# Patient Record
Sex: Female | Born: 1955 | Race: White | Hispanic: No | Marital: Single | Smoking: Current every day smoker
Health system: Southern US, Community
[De-identification: ages and names within clinical notes are randomized; demographics above are authoritative.]

## PROBLEM LIST (undated history)

## (undated) DIAGNOSIS — F419 Anxiety disorder, unspecified: Secondary | ICD-10-CM

## (undated) DIAGNOSIS — C4492 Squamous cell carcinoma of skin, unspecified: Secondary | ICD-10-CM

## (undated) HISTORY — DX: Squamous cell carcinoma of skin, unspecified: C44.92

## (undated) HISTORY — PX: SKIN CANCER EXCISION: SHX779

## (undated) HISTORY — DX: Anxiety disorder, unspecified: F41.9

---

## 2001-10-13 ENCOUNTER — Other Ambulatory Visit: Admission: RE | Admit: 2001-10-13 | Discharge: 2001-10-13 | Payer: Self-pay | Admitting: Obstetrics & Gynecology

## 2002-12-06 ENCOUNTER — Other Ambulatory Visit: Admission: RE | Admit: 2002-12-06 | Discharge: 2002-12-06 | Payer: Self-pay | Admitting: Obstetrics & Gynecology

## 2003-12-11 ENCOUNTER — Other Ambulatory Visit: Admission: RE | Admit: 2003-12-11 | Discharge: 2003-12-11 | Payer: Self-pay | Admitting: Obstetrics & Gynecology

## 2004-12-10 ENCOUNTER — Other Ambulatory Visit: Admission: RE | Admit: 2004-12-10 | Discharge: 2004-12-10 | Payer: Self-pay | Admitting: Obstetrics & Gynecology

## 2005-11-16 ENCOUNTER — Ambulatory Visit: Payer: Self-pay | Admitting: Gastroenterology

## 2005-12-03 ENCOUNTER — Ambulatory Visit: Payer: Self-pay | Admitting: Gastroenterology

## 2006-01-25 ENCOUNTER — Other Ambulatory Visit: Admission: RE | Admit: 2006-01-25 | Discharge: 2006-01-25 | Payer: Self-pay | Admitting: Obstetrics & Gynecology

## 2013-07-18 ENCOUNTER — Other Ambulatory Visit: Payer: Self-pay | Admitting: Obstetrics & Gynecology

## 2013-07-18 DIAGNOSIS — R928 Other abnormal and inconclusive findings on diagnostic imaging of breast: Secondary | ICD-10-CM

## 2013-08-22 ENCOUNTER — Ambulatory Visit
Admission: RE | Admit: 2013-08-22 | Discharge: 2013-08-22 | Disposition: A | Discharge status: Home / Self Care | Payer: BC Managed Care – PPO | Source: Ambulatory Visit | Attending: Obstetrics & Gynecology | Admitting: Obstetrics & Gynecology

## 2013-08-22 DIAGNOSIS — R928 Other abnormal and inconclusive findings on diagnostic imaging of breast: Secondary | ICD-10-CM

## 2015-12-06 ENCOUNTER — Encounter: Payer: Self-pay | Admitting: Gastroenterology

## 2016-11-11 DIAGNOSIS — Z01419 Encounter for gynecological examination (general) (routine) without abnormal findings: Secondary | ICD-10-CM | POA: Diagnosis not present

## 2016-11-11 DIAGNOSIS — Z681 Body mass index (BMI) 19 or less, adult: Secondary | ICD-10-CM | POA: Diagnosis not present

## 2016-11-11 DIAGNOSIS — Z1329 Encounter for screening for other suspected endocrine disorder: Secondary | ICD-10-CM | POA: Diagnosis not present

## 2016-11-11 DIAGNOSIS — Z1231 Encounter for screening mammogram for malignant neoplasm of breast: Secondary | ICD-10-CM | POA: Diagnosis not present

## 2016-11-13 ENCOUNTER — Other Ambulatory Visit: Payer: Self-pay | Admitting: Obstetrics & Gynecology

## 2016-11-13 DIAGNOSIS — R928 Other abnormal and inconclusive findings on diagnostic imaging of breast: Secondary | ICD-10-CM

## 2016-11-24 ENCOUNTER — Ambulatory Visit
Admission: RE | Admit: 2016-11-24 | Discharge: 2016-11-24 | Disposition: A | Discharge status: Home / Self Care | Payer: BLUE CROSS/BLUE SHIELD | Source: Ambulatory Visit | Attending: Obstetrics & Gynecology | Admitting: Obstetrics & Gynecology

## 2016-11-24 DIAGNOSIS — N6323 Unspecified lump in the left breast, lower outer quadrant: Secondary | ICD-10-CM | POA: Diagnosis not present

## 2016-11-24 DIAGNOSIS — R928 Other abnormal and inconclusive findings on diagnostic imaging of breast: Secondary | ICD-10-CM

## 2016-11-24 DIAGNOSIS — N6321 Unspecified lump in the left breast, upper outer quadrant: Secondary | ICD-10-CM | POA: Diagnosis not present

## 2017-01-11 DIAGNOSIS — H2513 Age-related nuclear cataract, bilateral: Secondary | ICD-10-CM | POA: Diagnosis not present

## 2017-05-18 DIAGNOSIS — R634 Abnormal weight loss: Secondary | ICD-10-CM | POA: Diagnosis not present

## 2017-05-18 DIAGNOSIS — R630 Anorexia: Secondary | ICD-10-CM | POA: Diagnosis not present

## 2017-05-18 DIAGNOSIS — E559 Vitamin D deficiency, unspecified: Secondary | ICD-10-CM | POA: Diagnosis not present

## 2017-05-18 DIAGNOSIS — Z0000 Encounter for general adult medical examination without abnormal findings: Secondary | ICD-10-CM | POA: Diagnosis not present

## 2017-05-19 ENCOUNTER — Other Ambulatory Visit: Payer: Self-pay | Admitting: Internal Medicine

## 2017-05-19 DIAGNOSIS — R634 Abnormal weight loss: Secondary | ICD-10-CM

## 2017-05-20 DIAGNOSIS — E559 Vitamin D deficiency, unspecified: Secondary | ICD-10-CM | POA: Diagnosis not present

## 2017-05-20 DIAGNOSIS — R634 Abnormal weight loss: Secondary | ICD-10-CM | POA: Diagnosis not present

## 2017-05-20 DIAGNOSIS — Z0000 Encounter for general adult medical examination without abnormal findings: Secondary | ICD-10-CM | POA: Diagnosis not present

## 2017-05-24 ENCOUNTER — Ambulatory Visit
Admission: RE | Admit: 2017-05-24 | Discharge: 2017-05-24 | Disposition: A | Discharge status: Home / Self Care | Payer: BLUE CROSS/BLUE SHIELD | Source: Ambulatory Visit | Attending: Internal Medicine | Admitting: Internal Medicine

## 2017-05-24 DIAGNOSIS — R634 Abnormal weight loss: Secondary | ICD-10-CM

## 2017-05-24 DIAGNOSIS — K7689 Other specified diseases of liver: Secondary | ICD-10-CM | POA: Diagnosis not present

## 2017-05-24 MED ORDER — IOPAMIDOL (ISOVUE-300) INJECTION 61%
100.0000 mL | Freq: Once | INTRAVENOUS | Status: AC | PRN
  Administered 2017-05-24: 100 mL via INTRAVENOUS

## 2017-06-01 ENCOUNTER — Other Ambulatory Visit: Payer: Self-pay | Admitting: Internal Medicine

## 2017-06-01 DIAGNOSIS — F17210 Nicotine dependence, cigarettes, uncomplicated: Secondary | ICD-10-CM

## 2017-06-01 DIAGNOSIS — F419 Anxiety disorder, unspecified: Secondary | ICD-10-CM | POA: Diagnosis not present

## 2017-06-03 ENCOUNTER — Ambulatory Visit
Admission: RE | Admit: 2017-06-03 | Discharge: 2017-06-03 | Disposition: A | Discharge status: Home / Self Care | Payer: BLUE CROSS/BLUE SHIELD | Source: Ambulatory Visit | Attending: Internal Medicine | Admitting: Internal Medicine

## 2017-06-03 DIAGNOSIS — Z87891 Personal history of nicotine dependence: Secondary | ICD-10-CM | POA: Diagnosis not present

## 2017-06-03 DIAGNOSIS — F17210 Nicotine dependence, cigarettes, uncomplicated: Secondary | ICD-10-CM

## 2017-06-08 DIAGNOSIS — F17210 Nicotine dependence, cigarettes, uncomplicated: Secondary | ICD-10-CM | POA: Diagnosis not present

## 2017-06-08 DIAGNOSIS — F419 Anxiety disorder, unspecified: Secondary | ICD-10-CM | POA: Diagnosis not present

## 2017-06-08 DIAGNOSIS — J449 Chronic obstructive pulmonary disease, unspecified: Secondary | ICD-10-CM | POA: Diagnosis not present

## 2017-06-08 DIAGNOSIS — R918 Other nonspecific abnormal finding of lung field: Secondary | ICD-10-CM | POA: Diagnosis not present

## 2017-07-12 DIAGNOSIS — F419 Anxiety disorder, unspecified: Secondary | ICD-10-CM | POA: Diagnosis not present

## 2017-07-12 DIAGNOSIS — R251 Tremor, unspecified: Secondary | ICD-10-CM | POA: Diagnosis not present

## 2017-08-12 DIAGNOSIS — F419 Anxiety disorder, unspecified: Secondary | ICD-10-CM | POA: Diagnosis not present

## 2017-08-12 DIAGNOSIS — Z0000 Encounter for general adult medical examination without abnormal findings: Secondary | ICD-10-CM | POA: Diagnosis not present

## 2017-09-07 ENCOUNTER — Ambulatory Visit: Payer: BLUE CROSS/BLUE SHIELD | Admitting: Neurology

## 2017-12-09 ENCOUNTER — Ambulatory Visit: Payer: BLUE CROSS/BLUE SHIELD | Admitting: Neurology

## 2017-12-09 ENCOUNTER — Encounter: Payer: Self-pay | Admitting: Neurology

## 2017-12-09 DIAGNOSIS — F40298 Other specified phobia: Secondary | ICD-10-CM | POA: Diagnosis not present

## 2017-12-09 DIAGNOSIS — F419 Anxiety disorder, unspecified: Secondary | ICD-10-CM

## 2017-12-09 HISTORY — DX: Anxiety disorder, unspecified: F41.9

## 2017-12-09 MED ORDER — FLUOXETINE HCL 10 MG PO CAPS: ORAL_CAPSULE | ORAL | 3 refills | Status: DC

## 2017-12-09 NOTE — Progress Notes (Signed)
Reason for visit: Nervousness, jitters  Referring physician: Dr. Kyla Balzarine Prevette is a 62 y.o. female  History of present illness:  Ms. Delisi is a 62 year old right-handed white female with a history of a problem with feeling nervous and anxious over the last several months, she believes the problem began around August 2018.  The patient had been on antidepressant medications in the distant past for menopausal symptoms, but she has been off of these medications.  The patient underwent blood work, she claims that she did have a thyroid study done that was normal.  The patient was placed on clonazepam but she did not feel good on this medication was switched to mirtazapine or taking 15 mg at night.  The patient has had troubles with her appetite, she has been losing weight.  She has lost about 14 pounds over the last 3 years or so.  The patient reports no true tremors.  She has a problem with feeling as if her mind is racing.  She has unusual phobias, she has a phobia of germs, she is irritated about food or other substances getting on her hands. She washes her hands multiple times a day.  She has problems with splashing water on her face, and she can no longer play with her dog or touch the dog's toys.  She did not celebrate Christmas this year because of some of these phobias.  The patient does not have a true history of depression in the past, however.  The patient has no true numbness or weakness of the extremities, she denies balance issues or troubles controlling the bowels or the bladder.  She does have occasional headaches and occasional dizziness.  She reports no shortness of breath, chest pain, or nausea or vomiting.  She has had some dry mouth on mirtazapine, she does not believe that she can tolerate a higher dose than 15 mg.  She is sent to this office for an evaluation.  Past Medical History:  Diagnosis Date  . Anxiety disorder 12/09/2017    History reviewed. No pertinent  surgical history.  Family History  Problem Relation Age of Onset  . Breast cancer Mother     Social history:  has no tobacco, alcohol, and drug history on file.  Medications:  Prior to Admission medications   Medication Sig Start Date End Date Taking? Authorizing Provider  guaiFENesin (MUCINEX) 600 MG 12 hr tablet Take 600 mg by mouth daily.   Yes [provider]  lansoprazole (PREVACID) 15 MG capsule Take 15 mg by mouth daily at 12 noon.   Yes [provider]  FLUoxetine (PROZAC) 10 MG capsule One capsule daily for 3 weeks, then take 2 capsules daily 12/09/17   York Spaniel, MD      Allergies  Allergen Reactions  . Aspirin Nausea Only    ROS:  Out of a complete 14 system review of symptoms, the patient complains only of the following symptoms, and all other reviewed systems are negative.  Chills, weight loss Blurred vision, double vision Constipation Feeling cold, increased thirst Muscle cramps Runny nose, skin sensitivity Confusion, headache, numbness, dizziness Depression, anxiety, disinterest in activities, racing thoughts Insomnia  Blood pressure 109/69, pulse 88, height 5' 5.5" (1.664 m), weight 99 lb (44.9 kg).  Physical Exam  General: The patient is alert and cooperative at the time of the examination.  Patient is thin.  Eyes: Pupils are equal, round, and reactive to light. Discs are flat bilaterally.  Neck: The  neck is supple, no carotid bruits are noted.  Respiratory: The respiratory examination is clear.  Cardiovascular: The cardiovascular examination reveals a regular rate and rhythm, no obvious murmurs or rubs are noted.  Skin: Extremities are without significant edema.  Neurologic Exam  Mental status: The patient is alert and oriented x 3 at the time of the examination. The patient has apparent normal recent and remote memory, with an apparently normal attention span and concentration ability.  Cranial nerves: Facial  symmetry is present. There is good sensation of the face to pinprick and soft touch bilaterally. The strength of the facial muscles and the muscles to head turning and shoulder shrug are normal bilaterally. Speech is well enunciated, no aphasia or dysarthria is noted. Extraocular movements are full. Visual fields are full. The tongue is midline, and the patient has symmetric elevation of the soft palate. No obvious hearing deficits are noted.  Motor: The motor testing reveals 5 over 5 strength of all 4 extremities. Good symmetric motor tone is noted throughout.  Sensory: Sensory testing is intact to pinprick, soft touch, vibration sensation, and position sense on all 4 extremities. No evidence of extinction is noted.  Coordination: Cerebellar testing reveals good finger-nose-finger and heel-to-shin bilaterally.  Gait and station: Gait is normal. Tandem gait is normal. Romberg is negative. No drift is seen.  Reflexes: Deep tendon reflexes are symmetric and normal bilaterally. Toes are downgoing bilaterally.   Assessment/Plan:  1.  Anxiety disorder, phobia disorder  The patient does not appear to have a primary neurologic problem.  She has developed a significant anxiety disorder with irrational phobias, mainly centered around fear of germs.  The phobias have altered her ability to function day-to-day, she is still able to maintain gainful employment.  She is having trouble sleeping at night in part because of this.  The patient will be taken off of mirtazapine, she will be started on Prozac, very high dose of this medication may be required, she will increase to 20 mg after 3 weeks.  She will call for dose adjustments in the future.  The patient will need therapy through a psychologist to engage in behavior therapy to improve her phobia issues.  The patient will follow-up in 6 months.  Marlan Palau. Keith Berwyn Bigley MD 12/09/2017 3:27 PM  [facility] [address] [place],  [place] [id number]  Phone [phone] Fax [phone]

## 2018-01-19 DIAGNOSIS — Z681 Body mass index (BMI) 19 or less, adult: Secondary | ICD-10-CM | POA: Diagnosis not present

## 2018-01-19 DIAGNOSIS — Z01419 Encounter for gynecological examination (general) (routine) without abnormal findings: Secondary | ICD-10-CM | POA: Diagnosis not present

## 2018-02-17 ENCOUNTER — Telehealth: Payer: Self-pay | Admitting: Neurology

## 2018-02-17 MED ORDER — FLUOXETINE HCL 40 MG PO CAPS: 40.0000 mg | ORAL_CAPSULE | Freq: Every day | ORAL | 3 refills | Status: DC

## 2018-02-17 NOTE — Telephone Encounter (Signed)
I got a call from her psychologist, Dr. Doran Heater, she is concerned that the patient may have some other underlying neurodegenerative process such as a frontotemporal dementia.  She also believes that the Prozac should be increased, we will go to 30 mg daily for a week and then go to 40 mg daily.  I will consider getting her set up for neuropsychological evaluation if the patient is amenable to this.   I called patient, left a message.  We will go up on the Prozac to 30 mg daily for a week and then go to 40 mg daily.  A prescription was sent in.  If she is amenable to the neuropsychological evaluation, she is to contact our office and we will get this set up.

## 2018-02-22 NOTE — Telephone Encounter (Signed)
I called the patient.  Left a message again, if she is amenable to neuropsychological evaluation, she is to call our office and let us know.

## 2018-02-22 NOTE — Telephone Encounter (Signed)
Patient is calling and would like to discuss having a neuropsychological evaluation. She can be reached at [phone] ext. [phone] until 4:30pm and after 5pm [phone].

## 2018-06-16 ENCOUNTER — Encounter: Payer: Self-pay | Admitting: Neurology

## 2018-06-16 ENCOUNTER — Ambulatory Visit: Payer: BLUE CROSS/BLUE SHIELD | Admitting: Neurology

## 2018-06-16 VITALS — BP 90/50 | HR 97 | Wt 107.0 lb

## 2018-06-16 DIAGNOSIS — F40298 Other specified phobia: Secondary | ICD-10-CM

## 2018-06-16 MED ORDER — FLUOXETINE HCL 20 MG PO CAPS: 60.0000 mg | ORAL_CAPSULE | Freq: Every day | ORAL | 3 refills | Status: DC

## 2018-06-16 NOTE — Patient Instructions (Signed)
We will go up on the prozac to 60 mg daily.

## 2018-06-16 NOTE — Progress Notes (Signed)
    Reason for visit: Anxiety disorder, OCD  Vanessa Escobar is an 62 y.o. female  History of present illness:   Vanessa Escobar is a 62 year old right-handed white female with a history of a chronic anxiety disorder.  The patient has significant features of OCD.  The patient has been to a therapist, she has improved some with cognitive/behavioral therapy.  There were some concerns that the patient may have a frontotemporal dementia, but the patient does not wish to undergo formal neuropsychological testing.  She denies any memory problems whatsoever.  She has gained some benefit with Prozac taking 40 mg daily, she has been able to gain some weight, she has gained 8 pounds since last seen.  She has gotten back to being able to interact with her dog, she is sleeping better.  She still has some underlying feeling of anxiety and fear with day-to-day living.  The patient returns to the office today for an evaluation.  Past Medical History:  Diagnosis Date  . Anxiety disorder 12/09/2017    History reviewed. No pertinent surgical history.  Family History  Problem Relation Age of Onset  . Breast cancer Mother     Social history:  has no tobacco, alcohol, and drug history on file.    Allergies  Allergen Reactions  . Aspirin Nausea Only    Medications:  Prior to Admission medications   Medication Sig Start Date End Date Taking? Authorizing Provider  FLUoxetine (PROZAC) 40 MG capsule Take 1 capsule (40 mg total) by mouth daily. 02/17/18  Yes York Spaniel, MD  guaiFENesin (MUCINEX) 600 MG 12 hr tablet Take 600 mg by mouth daily.   Yes [provider]  lansoprazole (PREVACID) 15 MG capsule Take 15 mg by mouth daily at 12 noon.   Yes [provider]    ROS:  Out of a complete 14 system review of symptoms, the patient complains only of the following symptoms, and all other reviewed systems are negative.  Chills Light sensitivity Headache Confusion, anxiety  Blood  pressure (!) 90/50, pulse 97, weight 107 lb (48.5 kg).  Physical Exam  General: The patient is alert and cooperative at the time of the examination.  Skin: No significant peripheral edema is noted.   Neurologic Exam  Mental status: The patient is alert and oriented x 3 at the time of the examination. The patient has apparent normal recent and remote memory, with an apparently normal attention span and concentration ability.   Cranial nerves: Facial symmetry is present. Speech is normal, no aphasia or dysarthria is noted. Extraocular movements are full. Visual fields are full.  Motor: The patient has good strength in all 4 extremities.  Sensory examination: Soft touch sensation is symmetric on the face, arms, and legs.  Coordination: The patient has good finger-nose-finger and heel-to-shin bilaterally.  Gait and station: The patient has a normal gait. Tandem gait is normal. Romberg is negative. No drift is seen.  Reflexes: Deep tendon reflexes are symmetric.   Assessment/Plan:  1.  Anxiety disorder, OCD  The Prozac will be increased to 60 mg daily.  The patient will need to continue on with her cognitive therapy, she is making some slow gains.  The patient will follow-up in 6 months.  She does not wish to undergo neuropsychological evaluation at this time.  Marlan Palau MD 06/16/2018 4:04 PM  [facility] [address] [place], [place] [address]  Phone [phone] Fax [phone]

## 2019-01-11 ENCOUNTER — Telehealth: Payer: Self-pay

## 2019-01-11 NOTE — Telephone Encounter (Signed)
I contacted the pt and lvm. I requested a call back to discuss appt scheduled for 01/16/19. Trying to verify it pt would be interested in virtual video visit or telephone visit.

## 2019-01-16 ENCOUNTER — Ambulatory Visit: Payer: BLUE CROSS/BLUE SHIELD | Admitting: Neurology

## 2019-06-26 ENCOUNTER — Other Ambulatory Visit: Payer: Self-pay | Admitting: Neurology

## 2019-07-03 ENCOUNTER — Other Ambulatory Visit: Payer: Self-pay | Admitting: Obstetrics & Gynecology

## 2019-07-03 DIAGNOSIS — N6320 Unspecified lump in the left breast, unspecified quadrant: Secondary | ICD-10-CM

## 2019-07-07 ENCOUNTER — Other Ambulatory Visit: Payer: Self-pay

## 2019-07-07 ENCOUNTER — Ambulatory Visit
Admission: RE | Admit: 2019-07-07 | Discharge: 2019-07-07 | Disposition: A | Discharge status: Home / Self Care | Payer: BLUE CROSS/BLUE SHIELD | Source: Ambulatory Visit | Attending: Obstetrics & Gynecology | Admitting: Obstetrics & Gynecology

## 2019-07-07 ENCOUNTER — Ambulatory Visit
Admission: RE | Admit: 2019-07-07 | Discharge: 2019-07-07 | Disposition: A | Discharge status: Home / Self Care | Payer: BC Managed Care – PPO | Source: Ambulatory Visit | Attending: Obstetrics & Gynecology | Admitting: Obstetrics & Gynecology

## 2019-07-07 ENCOUNTER — Other Ambulatory Visit: Payer: Self-pay | Admitting: Obstetrics & Gynecology

## 2019-07-07 DIAGNOSIS — N6320 Unspecified lump in the left breast, unspecified quadrant: Secondary | ICD-10-CM

## 2019-07-07 DIAGNOSIS — R928 Other abnormal and inconclusive findings on diagnostic imaging of breast: Secondary | ICD-10-CM | POA: Diagnosis not present

## 2019-07-07 DIAGNOSIS — N6489 Other specified disorders of breast: Secondary | ICD-10-CM | POA: Diagnosis not present

## 2019-07-11 ENCOUNTER — Ambulatory Visit: Payer: BLUE CROSS/BLUE SHIELD | Admitting: Neurology

## 2020-01-23 ENCOUNTER — Other Ambulatory Visit: Payer: Self-pay

## 2020-01-23 ENCOUNTER — Ambulatory Visit
Admission: RE | Admit: 2020-01-23 | Discharge: 2020-01-23 | Disposition: A | Discharge status: Home / Self Care | Payer: Medicare Other | Source: Ambulatory Visit | Attending: Obstetrics & Gynecology | Admitting: Obstetrics & Gynecology

## 2020-01-23 ENCOUNTER — Other Ambulatory Visit: Payer: Self-pay | Admitting: Obstetrics & Gynecology

## 2020-01-23 DIAGNOSIS — N6320 Unspecified lump in the left breast, unspecified quadrant: Secondary | ICD-10-CM

## 2020-07-25 ENCOUNTER — Other Ambulatory Visit: Payer: Self-pay

## 2020-07-25 ENCOUNTER — Other Ambulatory Visit: Payer: Self-pay | Admitting: Obstetrics & Gynecology

## 2020-07-25 ENCOUNTER — Ambulatory Visit
Admission: RE | Admit: 2020-07-25 | Discharge: 2020-07-25 | Disposition: A | Discharge status: Home / Self Care | Payer: Medicare Other | Source: Ambulatory Visit | Attending: Obstetrics & Gynecology | Admitting: Obstetrics & Gynecology

## 2020-07-25 DIAGNOSIS — N6320 Unspecified lump in the left breast, unspecified quadrant: Secondary | ICD-10-CM

## 2020-09-12 ENCOUNTER — Other Ambulatory Visit: Payer: Self-pay | Admitting: Neurology

## 2020-09-12 NOTE — Telephone Encounter (Signed)
Called patient to determine if she requested a refill on prozac, she said she had not.  She did not want an office visit either.  Thanked patient for her time. Prozac was refused.

## 2020-09-13 ENCOUNTER — Telehealth: Payer: Self-pay | Admitting: Neurology

## 2020-09-13 NOTE — Telephone Encounter (Signed)
Called pt back and informed her that she would need to schedule an appt for her medication management or she can call her PCP as well and they can fill the medication for her. Pt agreed to call PCP.

## 2020-09-13 NOTE — Telephone Encounter (Signed)
Pt called stating that she requested her refill on the FLUoxetine (PROZAC) 20 MG capsule but then someone called her mother asking if the mother called in for that prescription. Pt wants to know if we ever got the prescription request from the pharmacy. Please advise.

## 2020-09-17 ENCOUNTER — Ambulatory Visit (INDEPENDENT_AMBULATORY_CARE_PROVIDER_SITE_OTHER): Payer: Medicare Other | Admitting: Neurology

## 2020-09-17 ENCOUNTER — Encounter: Payer: Self-pay | Admitting: Neurology

## 2020-09-17 VITALS — BP 110/70 | HR 74 | Wt 127.0 lb

## 2020-09-17 DIAGNOSIS — F40298 Other specified phobia: Secondary | ICD-10-CM | POA: Diagnosis not present

## 2020-09-17 MED ORDER — FLUOXETINE HCL 20 MG PO CAPS: ORAL_CAPSULE | ORAL | 3 refills | Status: DC

## 2020-09-17 NOTE — Progress Notes (Signed)
I have read the note, and I agree with the clinical assessment and plan.  Amil Moseman K Hanaan Gancarz   

## 2020-09-17 NOTE — Patient Instructions (Signed)
Continue the Prozac  If primary care will refill, you can follow-up as needed at our office

## 2020-09-17 NOTE — Progress Notes (Signed)
PATIENT: Vanessa Escobar DOB: Sep 16, 1956  REASON FOR VISIT: follow up HISTORY FROM: patient  HISTORY OF PRESENT ILLNESS: Today 09/17/20 Vanessa Escobar is a 64 year old female with history of chronic anxiety disorder.  Has significant features of OCD.  Has not been seen since September 2019.  Has previously been to a therapist, had some improvement with cognitive behavioral therapy.  Some concern she may have frontotemporal dementia, but has not wish to undergo neuropsychological testing.  Denies any memory problems withsoever.  Is on Prozac. Tried multiple antidepressant over the years, none helped as much as Prozac. No longer sees psychologist, but still does CBT exercises. Phobias have improved.  Has since retired.  She is living alone, but is now able to interact with her dog, and social settings.  She was able to get the vaccine.  Her appetite has improved, has gained 20 pounds in the last 2 years.  She thinks she is overall doing great, is back to gardening, being outside.  Presents today for evaluation unaccompanied.  HISTORY 06/16/2018 Dr. Anne Hahn: Vanessa Escobar is a 64 year old right-handed white female with a history of a chronic anxiety disorder.  The patient has significant features of OCD.  The patient has been to a therapist, she has improved some with cognitive/behavioral therapy.  There were some concerns that the patient may have a frontotemporal dementia, but the patient does not wish to undergo formal neuropsychological testing.  She denies any memory problems whatsoever.  She has gained some benefit with Prozac taking 40 mg daily, she has been able to gain some weight, she has gained 8 pounds since last seen.  She has gotten back to being able to interact with her dog, she is sleeping better.  She still has some underlying feeling of anxiety and fear with day-to-day living.  The patient returns to the office today for an evaluation.   REVIEW OF SYSTEMS: Out of a complete 14 system  review of symptoms, the patient complains only of the following symptoms, and all other reviewed systems are negative.  anxiety  ALLERGIES: Allergies  Allergen Reactions  . Aspirin Nausea Only    HOME MEDICATIONS: Outpatient Medications Prior to Visit  Medication Sig Dispense Refill  . guaiFENesin (MUCINEX) 600 MG 12 hr tablet Take 600 mg by mouth. Twice weekly    . lansoprazole (PREVACID) 15 MG capsule Take 15 mg by mouth daily at 12 noon.    Marland Kitchen FLUoxetine (PROZAC) 20 MG capsule TAKE 3 CAPSULES(60 MG) BY MOUTH DAILY 270 capsule 3   No facility-administered medications prior to visit.    PAST MEDICAL HISTORY: Past Medical History:  Diagnosis Date  . Anxiety disorder 12/09/2017    PAST SURGICAL HISTORY: No past surgical history on file.  FAMILY HISTORY: Family History  Problem Relation Age of Onset  . Breast cancer Mother 11    SOCIAL HISTORY: Social History   Socioeconomic History  . Marital status: Single    Spouse name: Not on file  . Number of children: Not on file  . Years of education: Not on file  . Highest education level: Not on file  Occupational History  . Not on file  Tobacco Use  . Smoking status: Not on file  Substance and Sexual Activity  . Alcohol use: Not on file  . Drug use: Not on file  . Sexual activity: Not on file  Other Topics Concern  . Not on file  Social History Narrative  . Not on file   Social Determinants  of Health   Financial Resource Strain:   . Difficulty of Paying Living Expenses: Not on file  Food Insecurity:   . Worried About Programme researcher, broadcasting/film/video in the Last Year: Not on file  . Ran Out of Food in the Last Year: Not on file  Transportation Needs:   . Lack of Transportation (Medical): Not on file  . Lack of Transportation (Non-Medical): Not on file  Physical Activity:   . Days of Exercise per Week: Not on file  . Minutes of Exercise per Session: Not on file  Stress:   . Feeling of Stress : Not on file  Social  Connections:   . Frequency of Communication with Friends and Family: Not on file  . Frequency of Social Gatherings with Friends and Family: Not on file  . Attends Religious Services: Not on file  . Active Member of Clubs or Organizations: Not on file  . Attends Banker Meetings: Not on file  . Marital Status: Not on file  Intimate Partner Violence:   . Fear of Current or Ex-Partner: Not on file  . Emotionally Abused: Not on file  . Physically Abused: Not on file  . Sexually Abused: Not on file   PHYSICAL EXAM  Vitals:   09/17/20 0944  BP: 110/70  Pulse: 74  Weight: 127 lb (57.6 kg)   Body mass index is 20.81 kg/m.  Generalized: Well developed, in no acute distress   Neurological examination  Mentation: Alert oriented to time, place, history taking. Follows all commands speech and language fluent Cranial nerve II-XII: Pupils were equal round reactive to light. Extraocular movements were full, visual field were full on confrontational test. Facial sensation and strength were normal. Head turning and shoulder shrug  were normal and symmetric. Motor: The motor testing reveals 5 over 5 strength of all 4 extremities. Good symmetric motor tone is noted throughout.  Sensory: Sensory testing is intact to soft touch on all 4 extremities. No evidence of extinction is noted.  Coordination: Cerebellar testing reveals good finger-nose-finger and heel-to-shin bilaterally.  Gait and station: Gait is normal. Tandem gait is slightly unsteady. Romberg is negative. No drift is seen.  Reflexes: Deep tendon reflexes are symmetric and normal bilaterally.   DIAGNOSTIC DATA (LABS, IMAGING, TESTING) - I reviewed patient records, labs, notes, testing and imaging myself where available.  No results found for: WBC, HGB, HCT, MCV, PLT No results found for: NA, K, CL, CO2, GLUCOSE, BUN, CREATININE, CALCIUM, PROT, ALBUMIN, AST, ALT, ALKPHOS, BILITOT, GFRNONAA, GFRAA No results found for: CHOL,  HDL, LDLCALC, LDLDIRECT, TRIG, CHOLHDL No results found for: EUMP5T No results found for: VITAMINB12 No results found for: TSH  ASSESSMENT AND PLAN 64 y.o. year old female  has a past medical history of Anxiety disorder (12/09/2017). here with:  1.  Anxiety disorder, OCD  She continues to have great improvement with Prozac.  She will remain on Prozac 60 mg daily.  She denies any memory issues, does not wish to undergo neuropsychological evaluation.  She will continue follow-up with her primary care doctor, who can assume refills of Prozac, follow-up here on an as-needed basis.  She does not feel she needs a neurologist at this time, she is so much better with Prozac.  I spent 30 minutes of face-to-face and non-face-to-face time with patient.  This included previsit chart review, lab review, study review, order entry, electronic health record documentation, patient education.  Margie Ege, AGNP-C, DNP 09/17/2020, 10:19 AM [facility] [phone]  [address], [place], [place] [address] [phone]

## 2021-09-08 ENCOUNTER — Other Ambulatory Visit: Payer: Self-pay | Admitting: Obstetrics & Gynecology

## 2021-09-08 DIAGNOSIS — N6489 Other specified disorders of breast: Secondary | ICD-10-CM

## 2021-11-14 ENCOUNTER — Ambulatory Visit
Admission: RE | Admit: 2021-11-14 | Discharge: 2021-11-14 | Disposition: A | Discharge status: Home / Self Care | Payer: Medicare Other | Source: Ambulatory Visit | Attending: Obstetrics & Gynecology | Admitting: Obstetrics & Gynecology

## 2021-11-14 DIAGNOSIS — N6489 Other specified disorders of breast: Secondary | ICD-10-CM

## 2022-01-01 ENCOUNTER — Telehealth: Payer: Self-pay | Admitting: Neurology

## 2022-01-01 MED ORDER — FLUOXETINE HCL 20 MG PO CAPS: ORAL_CAPSULE | ORAL | 0 refills | Status: DC

## 2022-01-01 NOTE — Addendum Note (Signed)
Addended by: Verlin Grills on: 01/01/2022 03:01 PM ? ? Modules accepted: Orders ? ?

## 2022-01-01 NOTE — Telephone Encounter (Signed)
Pt has scheduled her 1 yr f/u and is asking for a refill on her FLUoxetine (PROZAC) 20 MG capsule to [facility] [id number]  ?

## 2022-01-01 NOTE — Telephone Encounter (Signed)
1 month refill has been sent for prozac. Further refills can be given at the time of the appt. ?

## 2022-01-29 ENCOUNTER — Encounter: Payer: Self-pay | Admitting: Neurology

## 2022-01-29 ENCOUNTER — Ambulatory Visit (INDEPENDENT_AMBULATORY_CARE_PROVIDER_SITE_OTHER): Payer: Medicare Other | Admitting: Neurology

## 2022-01-29 VITALS — BP 111/72 | HR 75 | Ht 66.0 in | Wt 136.0 lb

## 2022-01-29 DIAGNOSIS — F40298 Other specified phobia: Secondary | ICD-10-CM

## 2022-01-29 MED ORDER — FLUOXETINE HCL 20 MG PO CAPS: ORAL_CAPSULE | ORAL | 6 refills | Status: DC

## 2022-01-29 NOTE — Progress Notes (Signed)
? ?Patient: Vanessa Escobar ?Date of Birth: [birth year] ? ?Reason for Visit: follow up for anxiety ?History From: patient ?Primary Neurologist: Dr. Anne Hahn  ? ?HISTORY OF PRESENT ILLNESS: ?Today 01/29/22 ?Vanessa Escobar is here today for follow-up.  On Prozac from our office for chronic anxiety disorder, OCD. Is taking Prozac 60 mg daily. She was rationing pills last few months worried she would run out. At lower dose she didn't feel normal. When she came to our office in 2019 she was losing weight, 99 lbs. Now weights 136 lbs, but her appetite is back. A prior therapist for CBT had concerns for FTD, patient didn't want to undergo neuro psych eval, thinks this was because she got report of not remembering. 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 Lives alone, she is comfortable getting out. Anxiety is better controlled. Her dog has passed away. Has not seen primary care since coming here. Denies any memory issues. ? ?Update 09/17/2020 SS: Vanessa Escobar is a 66 year old female with history of chronic anxiety disorder.  Has significant features of OCD.  Has not been seen since September 2019.  Has previously been to a therapist, had some improvement with cognitive behavioral therapy.  Some concern she may have frontotemporal dementia, but has not wish to undergo neuropsychological testing.  Denies any memory problems withsoever.  Is on Prozac. Tried multiple antidepressant over the years, none helped as much as Prozac. No longer sees psychologist, but still does CBT exercises. Phobias have improved.  Has since retired.  She is living alone, but is now able to interact with her dog, and social settings.  She was able to get the vaccine.  Her appetite has improved, has gained 20 pounds in the last 2 years.  She thinks she is overall doing great, is back to gardening, being outside.  Presents today for evaluation unaccompanied. ? ?HISTORY ?06/16/2018 Dr. Anne Hahn: Vanessa Escobar is a 66 year old right-handed white female  with a history of a chronic anxiety disorder.  The patient has significant features of OCD.  The patient has been to a therapist, she has improved some with cognitive/behavioral therapy.  There were some concerns that the patient may have a frontotemporal dementia, but the patient does not wish to undergo formal neuropsychological testing.  She denies any memory problems whatsoever.  She has gained some benefit with Prozac taking 40 mg daily, she has been able to gain some weight, she has gained 8 pounds since last seen.  She has gotten back to being able to interact with her dog, she is sleeping better.  She still has some underlying feeling of anxiety and fear with day-to-day living.  The patient returns to the office today for an evaluation.  ? ?REVIEW OF SYSTEMS: Out of a complete 14 system review of symptoms, the patient complains only of the following symptoms, and all other reviewed systems are negative. ? ?See HPI ? ?ALLERGIES: ?Allergies  ?Allergen Reactions  ? Aspirin Nausea Only  ? ? ?HOME MEDICATIONS: ?Outpatient Medications Prior to Visit  ?Medication Sig Dispense Refill  ? FLUoxetine (PROZAC) 20 MG capsule TAKE 3 CAPSULES(60 MG) BY MOUTH DAILY 90 capsule 0  ? guaiFENesin (MUCINEX) 600 MG 12 hr tablet Take 600 mg by mouth. Twice weekly    ? lansoprazole (PREVACID) 15 MG capsule Take 15 mg by mouth daily at 12 noon.    ? ?No facility-administered medications prior to visit.  ? ? ?PAST MEDICAL HISTORY: ?Past Medical History:  ?Diagnosis Date  ? Anxiety disorder 12/09/2017  ? ? ?  PAST SURGICAL HISTORY: ?History reviewed. No pertinent surgical history. ? ?FAMILY HISTORY: ?Family History  ?Problem Relation Age of Onset  ? Breast cancer Mother 38  ? ? ?SOCIAL HISTORY: ?Social History  ? ?Socioeconomic History  ? Marital status: Single  ?  Spouse name: Not on file  ? Number of children: Not on file  ? Years of education: Not on file  ? Highest education level: Not on file  ?Occupational History  ? Not on file   ?Tobacco Use  ? Smoking status: Not on file  ? Smokeless tobacco: Not on file  ?Substance and Sexual Activity  ? Alcohol use: Not on file  ? Drug use: Not on file  ? Sexual activity: Not on file  ?Other Topics Concern  ? Not on file  ?Social History Narrative  ? Not on file  ? ?Social Determinants of Health  ? ?Financial Resource Strain: Not on file  ?Food Insecurity: Not on file  ?Transportation Needs: Not on file  ?Physical Activity: Not on file  ?Stress: Not on file  ?Social Connections: Not on file  ?Intimate Partner Violence: Not on file  ? ?PHYSICAL EXAM ? ?Vitals:  ? 01/29/22 0942  ?BP: 111/72  ?Pulse: 75  ?Weight: 136 lb (61.7 kg)  ?Height: 5\' 6"  (1.676 m)  ? ? ?Body mass index is 21.95 kg/m?. ? ?Generalized: Well developed, in no acute distress  ? ?Neurological examination  ?Mentation: Alert oriented to time, place, history taking. Follows all commands speech and language fluent ?Cranial nerve II-XII: Pupils were equal round reactive to light. Extraocular movements were full, visual field were full on confrontational test. Facial sensation and strength were normal. Head turning and shoulder shrug  were normal and symmetric. ?Motor: The motor testing reveals 5 over 5 strength of all 4 extremities. Good symmetric motor tone is noted throughout.  ?Sensory: Sensory testing is intact to soft touch on all 4 extremities. No evidence of extinction is noted.  ?Coordination: Cerebellar testing reveals good finger-nose-finger and heel-to-shin bilaterally.  ?Gait and station: Gait is normal. Tandem gait is slightly unsteady. ?Reflexes: Deep tendon reflexes are symmetric and normal bilaterally.  ? ?DIAGNOSTIC DATA (LABS, IMAGING, TESTING) ?- I reviewed patient records, labs, notes, testing and imaging myself where available. ? ?No results found for: WBC, HGB, HCT, MCV, PLT ?No results found for: NA, K, CL, CO2, GLUCOSE, BUN, CREATININE, CALCIUM, PROT, ALBUMIN, AST, ALT, ALKPHOS, BILITOT, GFRNONAA, GFRAA ?No results  found for: CHOL, HDL, LDLCALC, LDLDIRECT, TRIG, CHOLHDL ?No results found for: HGBA1C ?No results found for: VITAMINB12 ?No results found for: TSH ? ?ASSESSMENT AND PLAN ?66 y.o. year old female  has a past medical history of Anxiety disorder (12/09/2017). here with: ? ?1.  Anxiety disorder, OCD ? ?-Continues to report benefit from Prozac, I will refill Prozac 60 mg daily for 6 months ?-Needs a follow-up with primary care for refills going forward ?-Since her initial consult in 2019, she has gained 30 lbs now that appetite has returned, her BMI is 21.9 ?-Follow-up at our office on an as-needed basis, does not feel she needs a neurologist any further ? ?2020, AGNP-C, DNP 01/29/2022, 9:47 AM ?[facility] ?[address], [address] ?[place], [place] ?[phone] ? ? ?

## 2022-01-29 NOTE — Patient Instructions (Addendum)
I refilled Prozac for 6 months ?Need to see primary care for refills going forward, I would look at [facility] Practices to see where is close to you to see who is accepting new patients  ?Need to monitor the weight ?

## 2022-06-25 IMAGING — MG DIGITAL DIAGNOSTIC BILAT W/ TOMO W/ CAD
8 series · 9 of 24 positions shown · non-contrast
Comparison: Previous exam(s).

CLINICAL DATA: 65-year-old female for 2 year follow-up of LEFT
breast asymmetry, and for annual bilateral mammogram.

EXAM:
DIGITAL DIAGNOSTIC BILATERAL MAMMOGRAM WITH TOMOSYNTHESIS AND CAD
TECHNIQUE: Bilateral digital diagnostic mammography and breast tomosynthesis
was performed. The images were evaluated with computer-aided
detection.

[R CC synth-2D]
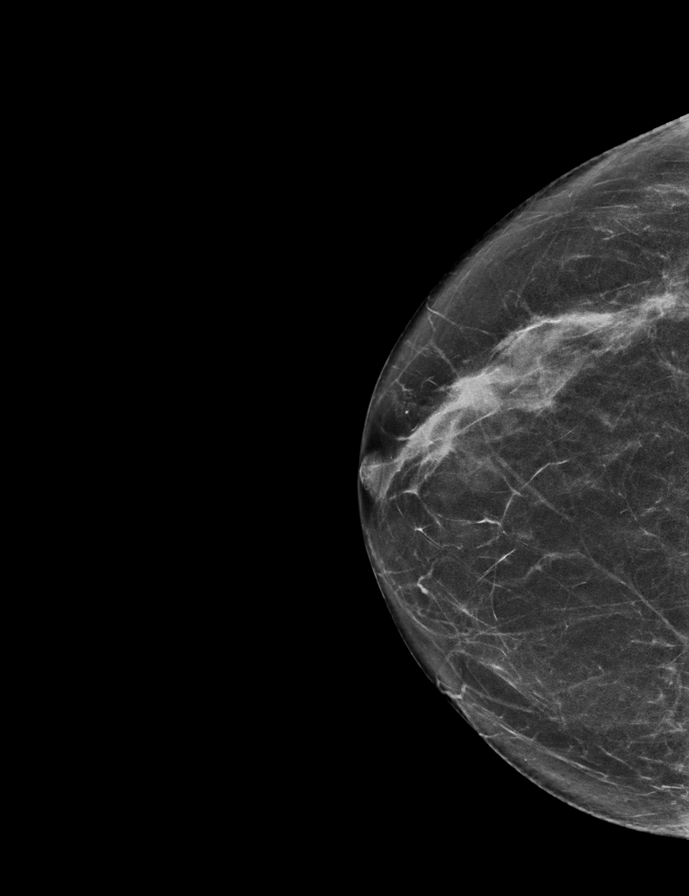

[R MLO synth-2D]
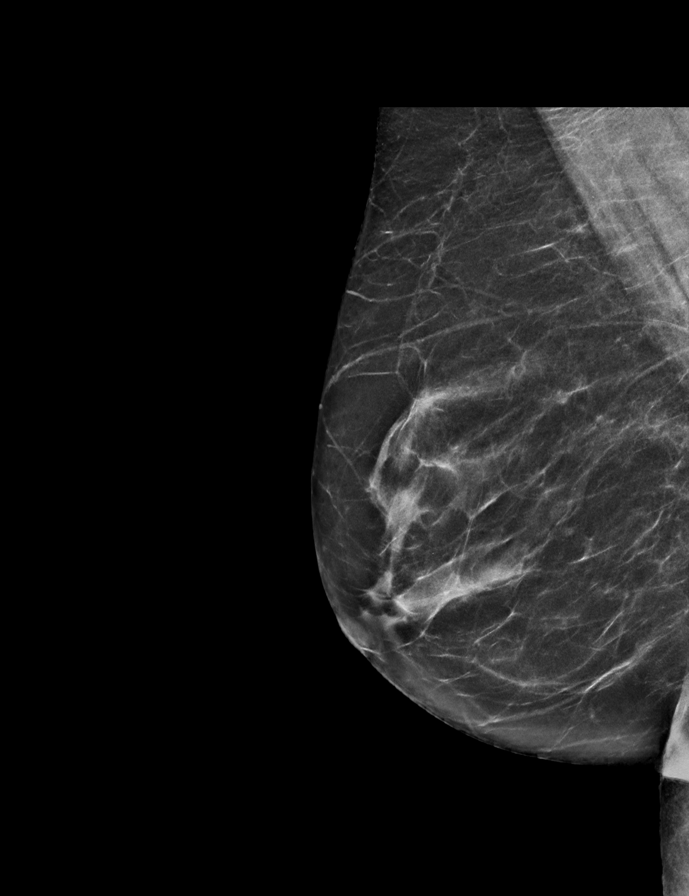

[L CC synth-2D]
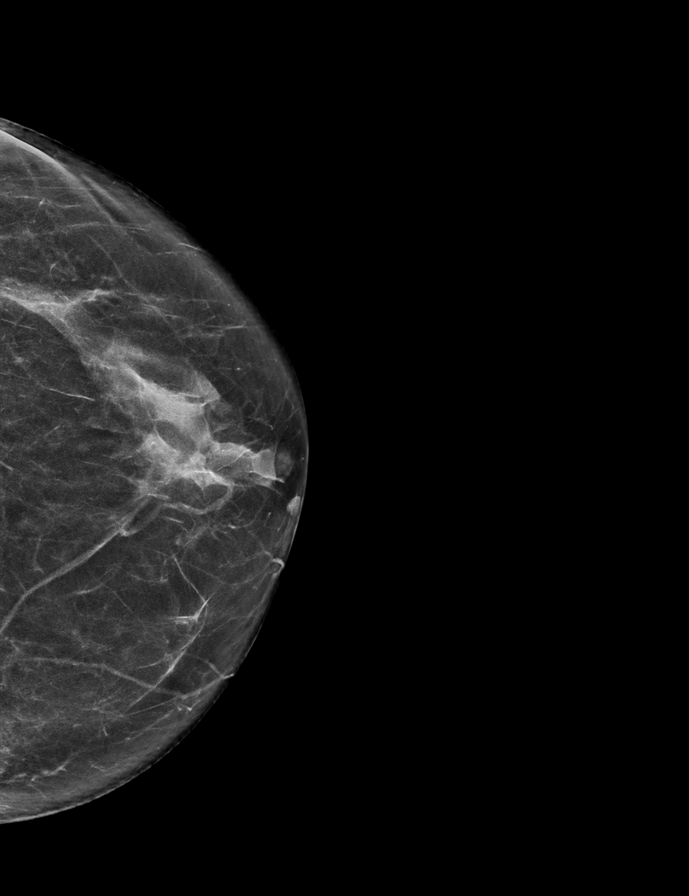

[L MLO synth-2D]
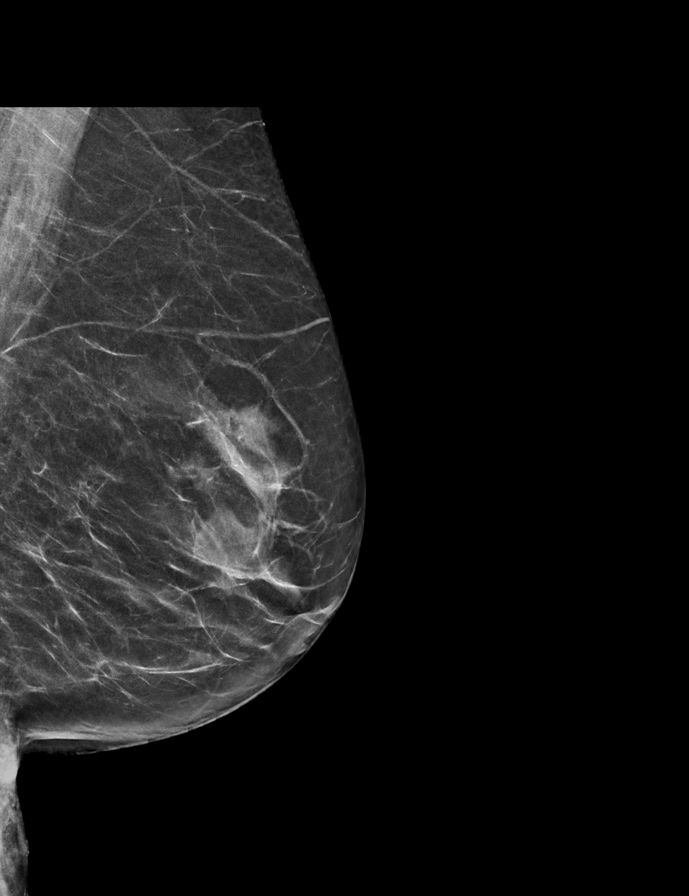

[R MLO tomo · 2 of 68 frames shown]
[frame 22/68]
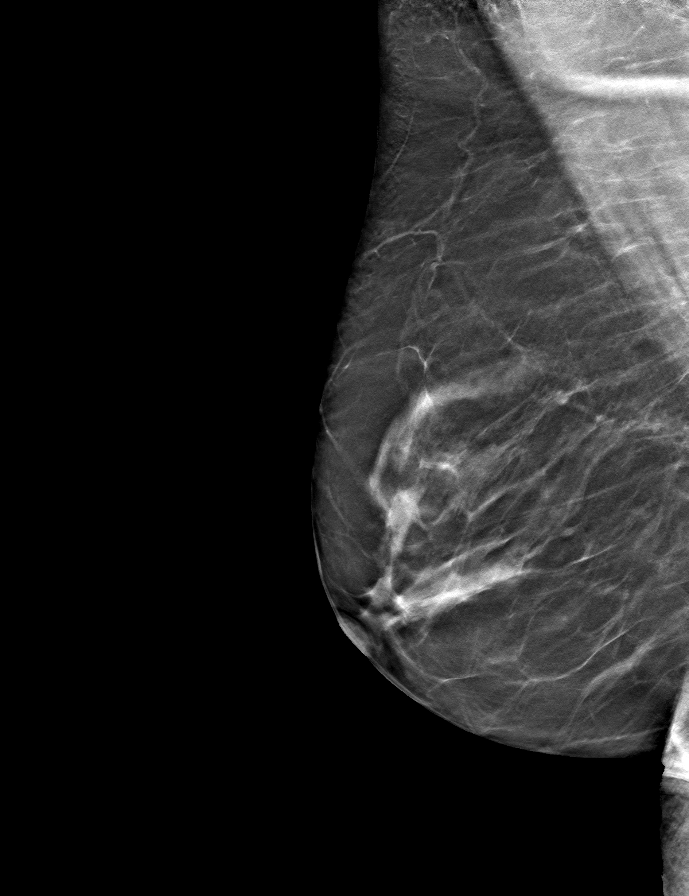
[frame 35/68]
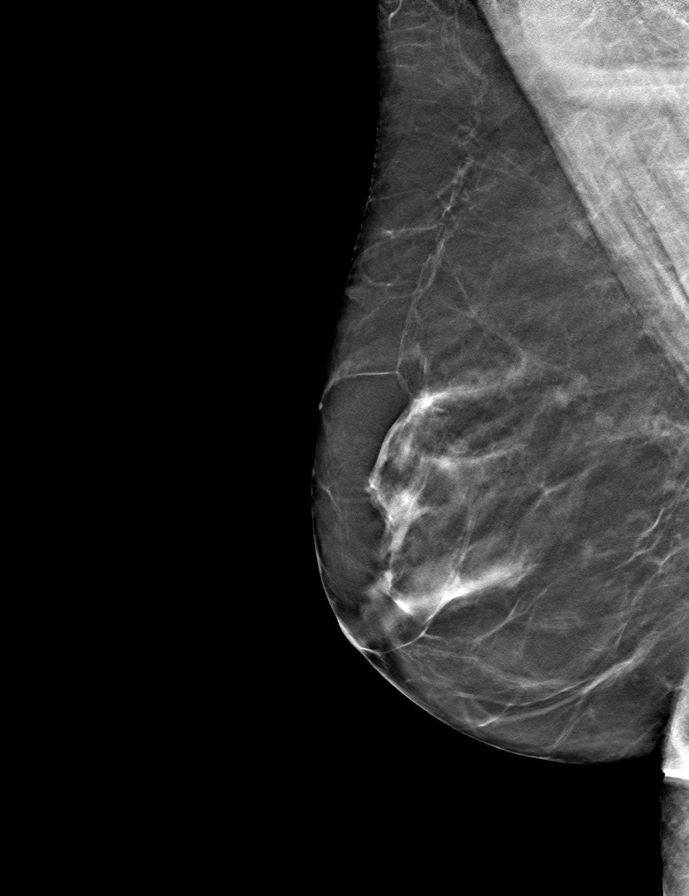

[R CC tomo · tomo slice 32/63.0]
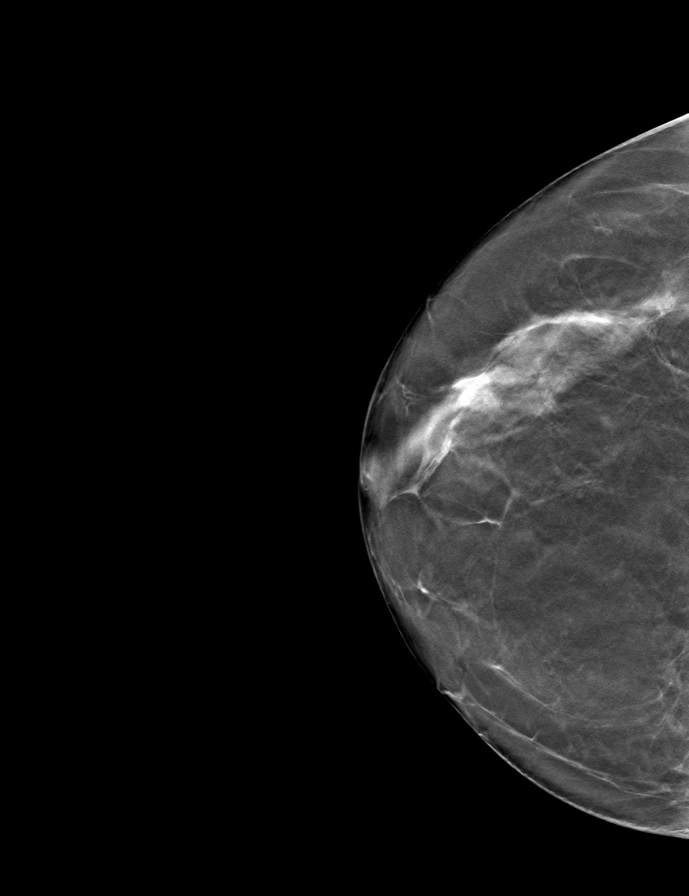

[L CC tomo · tomo slice 31/60.0]
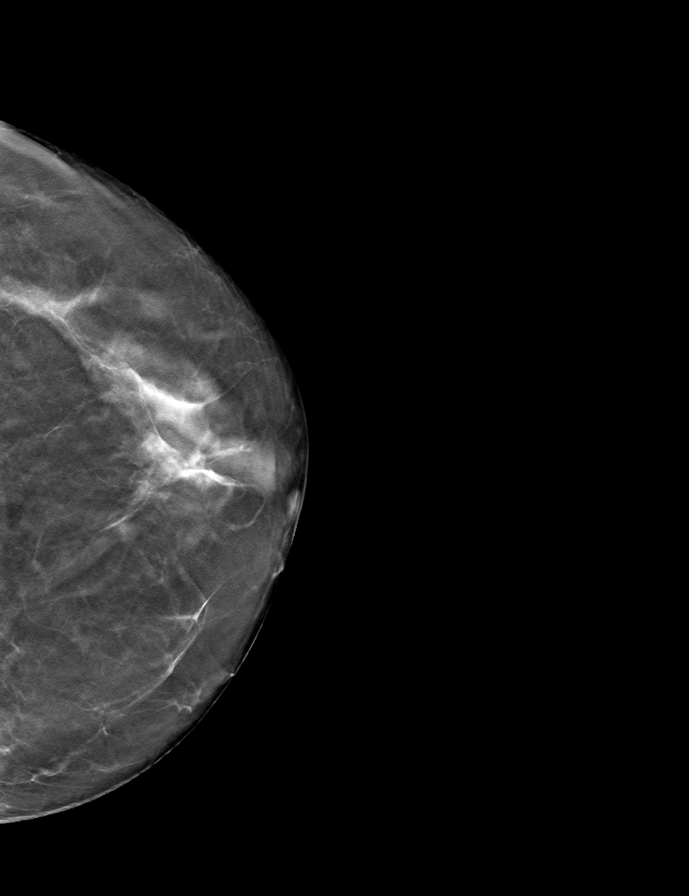

[L MLO tomo · tomo slice 31/62.0]
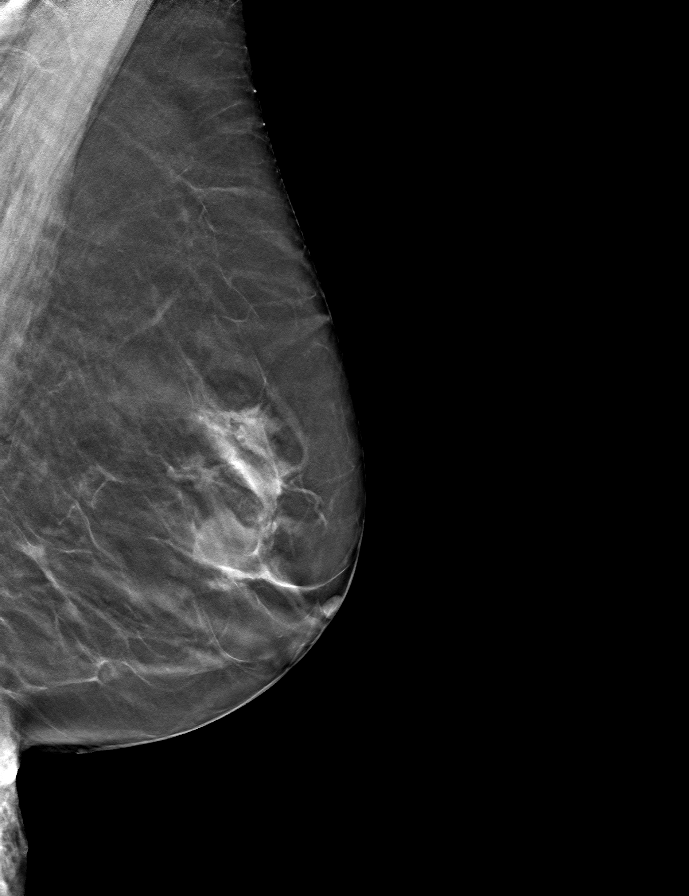

[9 of 24 positions shown; findings below may reference images not displayed]

ACR Breast Density Category b: There are scattered areas of
fibroglandular density.
FINDINGS: 2D/3D full field views of both breasts demonstrate a less
conspicuous OUTER LEFT breast asymmetry when compared to 2 years
ago.

No new or suspicious findings within the breasts identified.
IMPRESSION: 1. Less conspicuous OUTER LEFT breast asymmetry, compatible with a
benign process.
2. No new or suspicious mammographic findings within either breast.

RECOMMENDATION:
Bilateral screening mammogram in 1 year.

I have discussed the findings and recommendations with the patient.
If applicable, a reminder letter will be sent to the patient
regarding the next appointment.

BI-RADS CATEGORY  2: Benign.

## 2022-11-30 ENCOUNTER — Encounter (HOSPITAL_COMMUNITY): Payer: Self-pay

## 2022-11-30 ENCOUNTER — Ambulatory Visit (HOSPITAL_COMMUNITY)
Admission: EM | Admit: 2022-11-30 | Discharge: 2022-11-30 | Disposition: A | Discharge status: Home / Self Care | Payer: Medicare Other | Attending: Family Medicine | Admitting: Family Medicine

## 2022-11-30 DIAGNOSIS — L989 Disorder of the skin and subcutaneous tissue, unspecified: Secondary | ICD-10-CM | POA: Diagnosis not present

## 2022-11-30 MED ORDER — MUPIROCIN 2 % EX OINT: 1.0000 | TOPICAL_OINTMENT | Freq: Two times a day (BID) | CUTANEOUS | 0 refills | Status: DC

## 2022-11-30 NOTE — Discharge Instructions (Addendum)
When you get home, please call the general surgeons office for an appointment.  I have printed a referral in case that helps  Also please call the primary care offices that you are planning on making appointment with.  If it is going to be several months before they can see you, please consider going to the [facility] health website to schedule an appointment with primary care.  You can use the QR code/website at the back of the summary paperwork to schedule yourself a new patient appointment with primary care

## 2022-11-30 NOTE — ED Provider Notes (Signed)
[facility]    CSN: [id number] Arrival date & time: 11/30/22  0913      History   Chief Complaint Chief Complaint  Patient presents with   Leg Pain    HPI Vanessa Escobar is a 67 y.o. female.    Leg Pain  Here for a bump on her left lower leg.  It is been there since early January of this year.  [facility] 10 days of a sulfa antibiotic.  It is made no difference in the appearance of the lesion.  The patient has tried to dig it out with no benefit.  She does not have a PCP.  No fever or chills.  Past Medical History:  Diagnosis Date   Anxiety disorder 12/09/2017    Patient Active Problem List   Diagnosis Date Noted   Anxiety disorder 12/09/2017    History reviewed. No pertinent surgical history.  OB History   No obstetric history on file.      Home Medications    Prior to Admission medications   Medication Sig Start Date End Date Taking? Authorizing Provider  mupirocin ointment (BACTROBAN) 2 % Apply 1 Application topically 2 (two) times daily. To affected area till better 11/30/22  Yes Tremayne Sheldon, Gwenlyn Perking, MD  FLUoxetine (PROZAC) 20 MG capsule TAKE 3 CAPSULES(60 MG) BY MOUTH DAILY 01/29/22   Suzzanne Cloud, NP  guaiFENesin (MUCINEX) 600 MG 12 hr tablet Take 600 mg by mouth. Twice weekly    [provider]  lansoprazole (PREVACID) 15 MG capsule Take 15 mg by mouth daily at 12 noon.    [provider]    Family History Family History  Problem Relation Age of Onset   Breast cancer Mother 68    Social History Social History   Tobacco Use   Smoking status: Every Day    Packs/day: 1.00    Types: Cigarettes   Smokeless tobacco: Never  Vaping Use   Vaping Use: Never used  Substance Use Topics   Alcohol use: Never   Drug use: Never     Allergies   Aspirin   Review of Systems Review of Systems   Physical Exam Triage Vital Signs ED Triage Vitals [11/30/22  0949]  Enc Vitals Group     BP 120/69     Pulse Rate 94     Resp 14     Temp 98.6 F (37 C)     Temp Source Oral     SpO2 94 %     Weight      Height      Head Circumference      Peak Flow      Pain Score 8     Pain Loc      Pain Edu?      Excl. in [place]?    No data found.  Updated Vital Signs BP 120/69 (BP Location: Right Arm)   Pulse 94   Temp 98.6 F (37 C) (Oral)   Resp 14   SpO2 94%   Visual Acuity Right Eye Distance:   Left Eye Distance:   Bilateral Distance:    Right Eye Near:   Left Eye Near:    Bilateral Near:     Physical Exam Vitals reviewed.  Constitutional:      General: She is not in acute distress.    Appearance: She is not ill-appearing, toxic-appearing or diaphoretic.  Skin:    Coloration: Skin  is not pale.     Comments: There is a raised cornified round lesion.  It is about 2 cm in diameter and the central portion is blackened.  The patient states that is where she tried to get stuff to come out of it.  There is a faint halo of erythema around the base of the lesion, but there is no induration or tenderness there.  Neurological:     Mental Status: She is alert and oriented to person, place, and time.  Psychiatric:        Behavior: Behavior normal.      UC Treatments / Results  Labs (all labs ordered are listed, but only abnormal results are displayed) Labs Reviewed - No data to display  EKG   Radiology No results found.  Procedures Procedures (including critical care time)  Medications Ordered in UC Medications - No data to display  Initial Impression / Assessment and Plan / UC Course  I have reviewed the triage vital signs and the nursing notes.  Pertinent labs & imaging results that were available during my care of the patient were reviewed by me and considered in my medical decision making (see chart for details).       I discussed with the patient that the lesion needs a biopsy.  She is given contact information for  general surgery, and I have printed a referral in case that will help any.  I have asked her to call general surgery today when she gets home, and also called the primary care offices that she was wanting to schedule with.  If those primary care offices cannot see her in the next 2 or 3 months, I have asked her to please instead go to the [facility] health website and try to self schedule with a primary care Final Clinical Impressions(s) / UC Diagnoses   Final diagnoses:  Skin lesion of left leg     Discharge Instructions      When you get home, please call the general surgeons office for an appointment.  I have printed a referral in case that helps  Also please call the primary care offices that you are planning on making appointment with.  If it is going to be several months before they can see you, please consider going to the [facility] health website to schedule an appointment with primary care.  You can use the QR code/website at the back of the summary paperwork to schedule yourself a new patient appointment with primary care      ED Prescriptions     Medication Sig Dispense Auth. Provider   mupirocin ointment (BACTROBAN) 2 % Apply 1 Application topically 2 (two) times daily. To affected area till better 22 g Windy Carina Gwenlyn Perking, MD      PDMP not reviewed this encounter.   Barrett Henle, MD 11/30/22 [phone]

## 2022-11-30 NOTE — ED Triage Notes (Signed)
Patient reports that she has a raised area to the left lower leg. The area is red surrounding the area. Paatient  staes she has tried to squeeze something out and is now having increased left lower leg pain.  Patient state she was on a 10 day regimen of Bactrim

## 2023-01-21 HISTORY — PX: SKIN CANCER EXCISION: SHX779

## 2023-05-13 ENCOUNTER — Ambulatory Visit (INDEPENDENT_AMBULATORY_CARE_PROVIDER_SITE_OTHER): Payer: Medicare Other | Admitting: Family Medicine

## 2023-05-13 ENCOUNTER — Encounter: Payer: Self-pay | Admitting: Family Medicine

## 2023-05-13 VITALS — BP 108/80 | HR 60 | Temp 98.4°F | Ht 66.0 in | Wt 130.0 lb

## 2023-05-13 DIAGNOSIS — Z2821 Immunization not carried out because of patient refusal: Secondary | ICD-10-CM

## 2023-05-13 DIAGNOSIS — F419 Anxiety disorder, unspecified: Secondary | ICD-10-CM

## 2023-05-13 DIAGNOSIS — Z79899 Other long term (current) drug therapy: Secondary | ICD-10-CM

## 2023-05-13 DIAGNOSIS — E785 Hyperlipidemia, unspecified: Secondary | ICD-10-CM | POA: Diagnosis not present

## 2023-05-13 DIAGNOSIS — Z780 Asymptomatic menopausal state: Secondary | ICD-10-CM

## 2023-05-13 DIAGNOSIS — Z7689 Persons encountering health services in other specified circumstances: Secondary | ICD-10-CM

## 2023-05-13 DIAGNOSIS — Z1211 Encounter for screening for malignant neoplasm of colon: Secondary | ICD-10-CM

## 2023-05-13 DIAGNOSIS — E640 Sequelae of protein-calorie malnutrition: Secondary | ICD-10-CM

## 2023-05-13 DIAGNOSIS — F17218 Nicotine dependence, cigarettes, with other nicotine-induced disorders: Secondary | ICD-10-CM

## 2023-05-13 DIAGNOSIS — F17200 Nicotine dependence, unspecified, uncomplicated: Secondary | ICD-10-CM | POA: Diagnosis not present

## 2023-05-13 DIAGNOSIS — Z131 Encounter for screening for diabetes mellitus: Secondary | ICD-10-CM

## 2023-05-13 DIAGNOSIS — F40228 Other natural environment type phobia: Secondary | ICD-10-CM

## 2023-05-13 MED ORDER — FLUOXETINE HCL 20 MG PO CAPS: ORAL_CAPSULE | ORAL | 3 refills | Status: DC

## 2023-05-13 NOTE — Progress Notes (Signed)
I,Jameka J Llittleton, CMA,acting as a Neurosurgeon for [employer], NP.,have documented all relevant documentation on the behalf of Yakov Bergen, NP,as directed by  Ulysee Fyock Moshe Salisbury, NP while in the presence of Lexus Shampine, NP.  Subjective:  Patient ID: Vanessa Escobar , female    DOB: 02-05-1956 , 67 y.o.   MRN: [id number]  Chief Complaint  Patient presents with   Establish Care   Anxiety    HPI  Patient presents today to establish primary care. She reports she hasn't seen a PCP since 2018. 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      Past Medical History:  Diagnosis Date   Anxiety disorder 12/09/2017   Squamous cell skin cancer      Family History  Problem Relation Age of Onset   Breast cancer Mother 10   Dementia Father    Parkinson's disease Father      Current Outpatient Medications:    FLUoxetine (PROZAC) 20 MG capsule, TAKE 3 CAPSULES(60 MG) BY MOUTH DAILY, Disp: 90 capsule, Rfl: 3   Allergies  Allergen Reactions   Aspirin Nausea Only     Review of Systems  Constitutional: Negative.   Eyes: Negative.   Respiratory: Negative.    Musculoskeletal: Negative.   Skin: Negative.   Psychiatric/Behavioral: Negative.       Today's Vitals   05/13/23 1404  BP: 108/80  Pulse: 60  Temp: 98.4 F (36.9 C)  SpO2: 98%  Weight: 130 lb (59 kg)  Height: 5\' 6"  (1.676 m)  PainSc: 0-No pain   Body mass index is 20.98 kg/m.  Wt Readings from Last 3 Encounters:  05/13/23 130 lb (59 kg)  01/29/22 136 lb (61.7 kg)  09/17/20 127 lb (57.6 kg)     Objective:  Physical Exam HENT:     Head: Normocephalic.  Cardiovascular:     Rate and Rhythm: Normal rate and regular rhythm.  Musculoskeletal:        General: Normal range of motion.  Skin:    General:  Skin is warm and dry.  Neurological:     Mental Status: She is alert and oriented to person, place, and time.  Psychiatric:        Mood and Affect: Mood is elated.        Behavior: Behavior is cooperative.        Thought Content: Thought content normal.        Cognition and Memory: Cognition normal.         Assessment And Plan:  Establishing care with new doctor, encounter for  Anxiety Assessment & Plan: Chronic. Continue Prozac 60mg  every day.  Orders: -     TSH -     FLUoxetine HCl; TAKE 3 CAPSULES(60 MG) BY MOUTH DAILY  Dispense: 90 capsule; Refill: 3  Pneumococcal vaccination declined  Tetanus, diphtheria, and acellular pertussis (Tdap) vaccination declined  Screening for colon cancer -     Ambulatory referral to Gastroenterology  Long-term use of high-risk medication -     CBC -     CMP14+EGFR  Postmenopausal estrogen deficiency Assessment & Plan: Risk for osteoporosis. DEXA SCAN ordered  Orders: -     HM DEXA SCAN  Current every day smoker -     CT CHEST LUNG CANCER SCREENING LOW DOSE WO CONTRAST;  Future  Encounter for screening for diabetes mellitus -     Hemoglobin A1c  Hyperlipidemia LDL goal <100 Assessment & Plan: Check labs  Orders: -     Lipid panel    Return in 3 months (on 08/13/2023) for AMW.  Patient was given opportunity to ask questions. Patient verbalized understanding of the plan and was able to repeat key elements of the plan. All questions were answered to their satisfaction.    I, Reighn Kaplan, NP, have reviewed all documentation for this visit. The documentation on 05/17/23 for the exam, diagnosis, procedures, and orders are all accurate and complete.   IF YOU HAVE BEEN REFERRED TO A SPECIALIST, IT MAY TAKE 1-2 WEEKS TO SCHEDULE/PROCESS THE REFERRAL. IF YOU HAVE NOT HEARD FROM US/SPECIALIST IN TWO WEEKS, PLEASE GIVE [place] A CALL AT [phone] X [phone].

## 2023-05-17 ENCOUNTER — Other Ambulatory Visit: Payer: Self-pay | Admitting: Family Medicine

## 2023-05-17 DIAGNOSIS — F17218 Nicotine dependence, cigarettes, with other nicotine-induced disorders: Secondary | ICD-10-CM | POA: Insufficient documentation

## 2023-05-17 DIAGNOSIS — Z780 Asymptomatic menopausal state: Secondary | ICD-10-CM | POA: Insufficient documentation

## 2023-05-17 DIAGNOSIS — Z131 Encounter for screening for diabetes mellitus: Secondary | ICD-10-CM | POA: Insufficient documentation

## 2023-05-17 DIAGNOSIS — Z2821 Immunization not carried out because of patient refusal: Secondary | ICD-10-CM | POA: Insufficient documentation

## 2023-05-17 DIAGNOSIS — E785 Hyperlipidemia, unspecified: Secondary | ICD-10-CM | POA: Insufficient documentation

## 2023-05-17 DIAGNOSIS — F17200 Nicotine dependence, unspecified, uncomplicated: Secondary | ICD-10-CM | POA: Insufficient documentation

## 2023-05-17 DIAGNOSIS — Z79899 Other long term (current) drug therapy: Secondary | ICD-10-CM | POA: Insufficient documentation

## 2023-05-17 MED ORDER — ROSUVASTATIN CALCIUM 20 MG PO TABS
20.0000 mg | ORAL_TABLET | Freq: Every day | ORAL | 5 refills | Status: DC
Start: 2023-05-17 — End: 2023-12-17

## 2023-05-17 NOTE — Assessment & Plan Note (Signed)
Chronic. Continue Prozac 60mg  every day.

## 2023-05-17 NOTE — Progress Notes (Signed)
A1c 5.7 which is prediabetes, which concentrated sweets/carbohydrates in diets.   Total cholesterol 227 and LDL 161 (bad cholesterol) will sent statin to the pharmacy for cholesterol  Ellender Hose FNP-C

## 2023-05-17 NOTE — Assessment & Plan Note (Signed)
Check labs 

## 2023-05-17 NOTE — Assessment & Plan Note (Signed)
Risk for osteoporosis. DEXA SCAN ordered

## 2023-05-17 NOTE — Assessment & Plan Note (Signed)
Counseling given; patient not ready to quit. CT chest ordered, has not had one in years

## 2023-08-25 ENCOUNTER — Ambulatory Visit: Payer: Medicare Other

## 2023-08-25 VITALS — BP 118/70 | HR 99 | Temp 97.9°F | Ht 64.8 in | Wt 132.8 lb

## 2023-08-25 DIAGNOSIS — Z0000 Encounter for general adult medical examination without abnormal findings: Secondary | ICD-10-CM | POA: Diagnosis not present

## 2023-08-25 NOTE — Progress Notes (Signed)
Subjective:   Vanessa Escobar is a 67 y.o. female who presents for Medicare Annual (Subsequent) preventive examination.  Visit Complete: In person    Cardiac Risk Factors include: advanced age (>65men, >38 women)     Objective:    Today's Vitals   08/25/23 1450  BP: 118/70  Pulse: 99  Temp: 97.9 F (36.6 C)  TempSrc: Oral  SpO2: 93%  Weight: 132 lb 12.8 oz (60.2 kg)  Height: 5' 4.8" (1.646 m)   Body mass index is 22.24 kg/m.     08/25/2023    3:01 PM  Advanced Directives  Does Patient Have a Medical Advance Directive? No  Would patient like information on creating a medical advance directive? No - Patient declined    Current Medications (verified) Outpatient Encounter Medications as of 08/25/2023  Medication Sig   FLUoxetine (PROZAC) 20 MG capsule TAKE 3 CAPSULES(60 MG) BY MOUTH DAILY   rosuvastatin (CRESTOR) 20 MG tablet Take 1 tablet (20 mg total) by mouth daily.   No facility-administered encounter medications on file as of 08/25/2023.    Allergies (verified) Aspirin   History: Past Medical History:  Diagnosis Date   Anxiety disorder 12/09/2017   Squamous cell skin cancer    Past Surgical History:  Procedure Laterality Date   SKIN CANCER EXCISION Left 01/21/2023   left lower leg, squamos cell   Family History  Problem Relation Age of Onset   Breast cancer Mother 78   Dementia Father    Parkinson's disease Father    Social History   Socioeconomic History   Marital status: Single    Spouse name: Not on file   Number of children: Not on file   Years of education: Not on file   Highest education level: Not on file  Occupational History   Not on file  Tobacco Use   Smoking status: Every Day    Current packs/day: 1.00    Types: Cigarettes   Smokeless tobacco: Never  Vaping Use   Vaping status: Never Used  Substance and Sexual Activity   Alcohol use: Never   Drug use: Never   Sexual activity: Not on file  Other Topics Concern    Not on file  Social History Narrative   Not on file   Social Determinants of Health   Financial Resource Strain: Low Risk  (08/25/2023)   Overall Financial Resource Strain (CARDIA)    Difficulty of Paying Living Expenses: Not hard at all  Food Insecurity: No Food Insecurity (08/25/2023)   Hunger Vital Sign    Worried About Running Out of Food in the Last Year: Never true    Ran Out of Food in the Last Year: Never true  Transportation Needs: No Transportation Needs (08/25/2023)   PRAPARE - Administrator, Civil Service (Medical): No    Lack of Transportation (Non-Medical): No  Physical Activity: Inactive (08/25/2023)   Exercise Vital Sign    Days of Exercise per Week: 0 days    Minutes of Exercise per Session: 0 min  Stress: Stress Concern Present (08/25/2023)   [employer] of Occupational Health - Occupational Stress Questionnaire    Feeling of Stress : To some extent  Social Connections: Socially Isolated (08/25/2023)   Social Connection and Isolation Panel [NHANES]    Frequency of Communication with Friends and Family: More than three times a week    Frequency of Social Gatherings with Friends and Family: Never    Attends Religious Services: Never  Active Member of Clubs or Organizations: No    Attends Banker Meetings: Never    Marital Status: Separated    Tobacco Counseling Ready to quit: No Counseling given: Not Answered   Clinical Intake:  Pre-visit preparation completed: Yes  Pain : No/denies pain     Nutritional Risks: None Diabetes: No  How often do you need to have someone help you when you read instructions, pamphlets, or other written materials from your doctor or pharmacy?: 1 - Never  Interpreter Needed?: No  Information entered by :: NAllen LPN   Activities of Daily Living    08/25/2023    2:51 PM  In your present state of health, do you have any difficulty performing the following activities:  Hearing? 0   Vision? 0  Difficulty concentrating or making decisions? 0  Walking or climbing stairs? 0  Dressing or bathing? 0  Doing errands, shopping? 0  Preparing Food and eating ? N  Using the Toilet? N  In the past six months, have you accidently leaked urine? N  Do you have problems with loss of bowel control? N  Managing your Medications? N  Managing your Finances? N  Housekeeping or managing your Housekeeping? N    Patient Care Team: Ellender Hose, NP as PCP - General (Family Medicine)  Indicate any recent Medical Services you may have received from other than [facility] providers in the past year (date may be approximate).     Assessment:   This is a routine wellness examination for Victoria.  Hearing/Vision screen Hearing Screening - Comments:: Denies hearing issues Vision Screening - Comments:: Regular eye exams,    Goals Addressed             This Visit's Progress    Patient Stated       08/25/2023, stay sane       Depression Screen    08/25/2023    3:03 PM  PHQ 2/9 Scores  PHQ - 2 Score 1  PHQ- 9 Score 1    Fall Risk    08/25/2023    3:02 PM  Fall Risk   Falls in the past year? 0  Number falls in past yr: 0  Injury with Fall? 0  Risk for fall due to : Medication side effect  Follow up Falls prevention discussed;Falls evaluation completed    MEDICARE RISK AT HOME: Medicare Risk at Home Any stairs in or around the home?: Yes If so, are there any without handrails?: No Home free of loose throw rugs in walkways, pet beds, electrical cords, etc?: Yes Adequate lighting in your home to reduce risk of falls?: Yes Life alert?: No Use of a cane, walker or w/c?: No Grab bars in the bathroom?: No Shower chair or bench in shower?: No Elevated toilet seat or a handicapped toilet?: Yes  TIMED UP AND GO:  Was the test performed?  Yes  Length of time to ambulate 10 feet: 5 sec Gait steady and fast without use of assistive device    Cognitive Function:         08/25/2023    3:04 PM  6CIT Screen  What Year? 0 points  What month? 0 points  What time? 0 points  Count back from 20 0 points  Months in reverse 0 points  Repeat phrase 4 points  Total Score 4 points    Immunizations  There is no immunization history on file for this patient.  TDAP status: Due, Education has been provided regarding  the importance of this vaccine. Advised may receive this vaccine at local pharmacy or Health Dept. Aware to provide a copy of the vaccination record if obtained from local pharmacy or Health Dept. Verbalized acceptance and understanding.  Flu Vaccine status: Declined, Education has been provided regarding the importance of this vaccine but patient still declined. Advised may receive this vaccine at local pharmacy or Health Dept. Aware to provide a copy of the vaccination record if obtained from local pharmacy or Health Dept. Verbalized acceptance and understanding.  Pneumococcal vaccine status: Declined,  Education has been provided regarding the importance of this vaccine but patient still declined. Advised may receive this vaccine at local pharmacy or Health Dept. Aware to provide a copy of the vaccination record if obtained from local pharmacy or Health Dept. Verbalized acceptance and understanding.   Covid-19 vaccine status: Declined, Education has been provided regarding the importance of this vaccine but patient still declined. Advised may receive this vaccine at local pharmacy or Health Dept.or vaccine clinic. Aware to provide a copy of the vaccination record if obtained from local pharmacy or Health Dept. Verbalized acceptance and understanding.  Qualifies for Shingles Vaccine? Yes   Zostavax completed No   Shingrix Completed?: No.    Education has been provided regarding the importance of this vaccine. Patient has been advised to call insurance company to determine out of pocket expense if they have not yet received this vaccine. Advised may also  receive vaccine at local pharmacy or Health Dept. Verbalized acceptance and understanding.  Screening Tests Health Maintenance  Topic Date Due   Hepatitis C Screening  Never done   Colonoscopy  12/04/2015   COVID-19 Vaccine (1) 09/10/2023 (Originally 07/19/1961)   Zoster Vaccines- Shingrix (1 of 2) 11/25/2023 (Originally 07/18/1973)   INFLUENZA VACCINE  01/10/2024 (Originally 05/13/2023)   Pneumonia Vaccine 53+ Years old (1 of 2 - PCV) 05/12/2024 (Originally 07/18/1962)   MAMMOGRAM  11/15/2023   Medicare Annual Wellness (AWV)  08/24/2024   DEXA SCAN  Completed   HPV VACCINES  Aged Out   DTaP/Tdap/Td  Discontinued    Health Maintenance  Health Maintenance Due  Topic Date Due   Hepatitis C Screening  Never done   Colonoscopy  12/04/2015    Colorectal cancer screening: declines at this time   Mammogram status: Completed 11/14/2021. Repeat every 2 years  Bone Density status: declines at this time  Lung Cancer Screening: (Low Dose CT Chest recommended if Age 35-80 years, 20 pack-year currently smoking OR have quit w/in 15years.) does qualify.   Lung Cancer Screening Referral: declines at this time  Additional Screening:  Hepatitis C Screening: does qualify  Vision Screening: Recommended annual ophthalmology exams for early detection of glaucoma and other disorders of the eye. Is the patient up to date with their annual eye exam?  No  Who is the provider or what is the name of the office in which the patient attends annual eye exams? none If pt is not established with a provider, would they like to be referred to a provider to establish care? No .   Dental Screening: Recommended annual dental exams for proper oral hygiene  Diabetic Foot Exam: n/a  Community Resource Referral / Chronic Care Management: CRR required this visit?  No   CCM required this visit?  No     Plan:     I have personally reviewed and noted the following in the patient's chart:   Medical and social  history Use of alcohol, tobacco or  illicit drugs  Current medications and supplements including opioid prescriptions. Patient is not currently taking opioid prescriptions. Functional ability and status Nutritional status Physical activity Advanced directives List of other physicians Hospitalizations, surgeries, and ER visits in previous 12 months Vitals Screenings to include cognitive, depression, and falls Referrals and appointments  In addition, I have reviewed and discussed with patient certain preventive protocols, quality metrics, and best practice recommendations. A written personalized care plan for preventive services as well as general preventive health recommendations were provided to patient.     Barb Merino, LPN   [id number]/07/[id number]   After Visit Summary: (In Person-Printed) AVS printed and given to the patient  Nurse Notes: none

## 2023-08-25 NOTE — Patient Instructions (Signed)
Vanessa Escobar , Thank you for taking time to come for your Medicare Wellness Visit. I appreciate your ongoing commitment to your health goals. Please review the following plan we discussed and let me know if I can assist you in the future.   Referrals/Orders/Follow-Ups/Clinician Recommendations: none  This is a list of the screening recommended for you and due dates:  Health Maintenance  Topic Date Due   Hepatitis C Screening  Never done   Colon Cancer Screening  12/04/2015   COVID-19 Vaccine (1) 09/10/2023*   Zoster (Shingles) Vaccine (1 of 2) 11/25/2023*   Flu Shot  01/10/2024*   Pneumonia Vaccine (1 of 2 - PCV) 05/12/2024*   Mammogram  11/15/2023   Medicare Annual Wellness Visit  08/24/2024   DEXA scan (bone density measurement)  Completed   HPV Vaccine  Aged Out   DTaP/Tdap/Td vaccine  Discontinued  *Topic was postponed. The date shown is not the original due date.    Advanced directives: (Declined) Advance directive discussed with you today. Even though you declined this today, please call our office should you change your mind, and we can give you the proper paperwork for you to fill out.  Next Medicare Annual Wellness Visit scheduled for next year: No, office will schedule appointment  Insert Preventive Care attachment Insert FALL PREVENTION attachment if needed

## 2023-12-17 ENCOUNTER — Other Ambulatory Visit: Payer: Self-pay | Admitting: Family Medicine

## 2024-02-03 ENCOUNTER — Encounter (HOSPITAL_COMMUNITY): Payer: Self-pay | Admitting: Emergency Medicine

## 2024-02-03 ENCOUNTER — Ambulatory Visit (INDEPENDENT_AMBULATORY_CARE_PROVIDER_SITE_OTHER)

## 2024-02-03 ENCOUNTER — Ambulatory Visit (HOSPITAL_COMMUNITY)
Admission: EM | Admit: 2024-02-03 | Discharge: 2024-02-03 | Disposition: A | Discharge status: Home / Self Care | Attending: Family Medicine | Admitting: Family Medicine

## 2024-02-03 ENCOUNTER — Other Ambulatory Visit: Payer: Self-pay

## 2024-02-03 DIAGNOSIS — Z23 Encounter for immunization: Secondary | ICD-10-CM | POA: Diagnosis not present

## 2024-02-03 DIAGNOSIS — S61213A Laceration without foreign body of left middle finger without damage to nail, initial encounter: Secondary | ICD-10-CM

## 2024-02-03 MED ORDER — TETANUS-DIPHTH-ACELL PERTUSSIS 5-2.5-18.5 LF-MCG/0.5 IM SUSY
0.5000 mL | PREFILLED_SYRINGE | Freq: Once | INTRAMUSCULAR | Status: AC
  Administered 2024-02-03: 0.5 mL via INTRAMUSCULAR

## 2024-02-03 MED ORDER — AMOXICILLIN-POT CLAVULANATE 875-125 MG PO TABS: 1.0000 | ORAL_TABLET | Freq: Two times a day (BID) | ORAL | 0 refills | Status: AC

## 2024-02-03 MED ORDER — TETANUS-DIPHTH-ACELL PERTUSSIS 5-2.5-18.5 LF-MCG/0.5 IM SUSY
PREFILLED_SYRINGE | INTRAMUSCULAR | Status: AC
  Filled 2024-02-03: qty 0.5

## 2024-02-03 NOTE — ED Notes (Signed)
 Have encouraged patient to soak finger.  Patient was very sensitive to removal of dressing and attempts to soak finger

## 2024-02-03 NOTE — ED Triage Notes (Signed)
 Patient presenting with a laceration to the anterior left middle finger. Laceration is along the tip of the finger crossing the distal joint.   Patient states that around 4 hours ago she was using an electric pruning saw in her garden with new gardening gloves. The gloves were pulled into the saw causing it to lacerate the middle and pointer fingers of the left hand.   Patient states it has been over 5 years since she had a tetanus shot. States she is not on any blood thinners. Wound continues to bleed and be very painful.   Provider at bedside to triage.

## 2024-02-08 NOTE — ED Provider Notes (Addendum)
 [facility]   [id number] 02/03/24 Arrival Time: 1538  ASSESSMENT & PLAN:  1. Laceration of left middle finger without damage to nail, foreign body presence unspecified, initial encounter    I have personally viewed and independently interpreted the imaging studies ordered this visit. LEFT 3rd finger: no acute bony abnormality or radiopaque FB appreciated.   Meds ordered this encounter  Medications   Tdap (BOOSTRIX ) injection 0.5 mL   amoxicillin -clavulanate (AUGMENTIN ) 875-125 MG tablet    Sig: Take 1 tablet by mouth every 12 (twelve) hours.    Dispense:  14 tablet    Refill:  0   Augmentin  to prevent infection. Laceration is small and macerated and not conductive to suturing; will let heal by secondary intention.  Reviewed expectations re: course of current medical issues. Questions answered. Outlined signs and symptoms indicating need for more acute intervention. Patient verbalized understanding. After Visit Summary given.   SUBJECTIVE:  Vanessa Escobar is a 68 y.o. female who presents with a laceration of left middle finger; using powered hedge clippers that caught her glove thus pulling finger in with resulting laceration. Still bleeding at times. Denies extremity sensation changes or weakness.    OBJECTIVE:  Vitals:   02/03/24 1710  BP: 105/78  Pulse: 89  Resp: 18  Temp: 98.2 F (36.8 C)  TempSrc: Oral  SpO2: 96%    General appearance: alert; no distress LEFT 3rd finger: macerated sub-cm laceration of distal 3rd finger;no obvious foreign bodies; mild bleeding; normal distal sensation; normal capillary refill Psychological: alert and cooperative; normal mood and affect  DG Finger Middle Left Result Date: 02/03/2024 CLINICAL DATA:  Laceration of the middle finger. EXAM: LEFT MIDDLE FINGER 2+V COMPARISON:  None Available. FINDINGS: Laceration along the palmar aspect of the distal third digit. No radiopaque foreign body. No acute fracture or  dislocation. Mild degenerative changes at the first [facility] joint. IMPRESSION: No acute osseous abnormality. Electronically Signed   By: Mannie Seek M.D.   On: 02/03/2024 18:27     Labs Reviewed - No data to display  No results found.  Allergies  Allergen Reactions   Aspirin Nausea Only    Past Medical History:  Diagnosis Date   Anxiety disorder 12/09/2017   Squamous cell skin cancer    Social History   Socioeconomic History   Marital status: Single    Spouse name: Not on file   Number of children: Not on file   Years of education: Not on file   Highest education level: Not on file  Occupational History   Not on file  Tobacco Use   Smoking status: Every Day    Current packs/day: 1.00    Types: Cigarettes   Smokeless tobacco: Never  Vaping Use   Vaping status: Never Used  Substance and Sexual Activity   Alcohol use: Never   Drug use: Never   Sexual activity: Not on file  Other Topics Concern   Not on file  Social History Narrative   Not on file   Social Drivers of Health   Financial Resource Strain: Low Risk  (08/25/2023)   Overall Financial Resource Strain (CARDIA)    Difficulty of Paying Living Expenses: Not hard at all  Food Insecurity: No Food Insecurity (08/25/2023)   Hunger Vital Sign    Worried About Running Out of Food in the Last Year: Never true    Ran Out of Food in the Last Year: Never true  Transportation Needs: No Transportation Needs (08/25/2023)   PRAPARE -  Administrator, Civil Service (Medical): No    Lack of Transportation (Non-Medical): No  Physical Activity: Inactive (08/25/2023)   Exercise Vital Sign    Days of Exercise per Week: 0 days    Minutes of Exercise per Session: 0 min  Stress: Stress Concern Present (08/25/2023)   [employer] of Occupational Health - Occupational Stress Questionnaire    Feeling of Stress : To some extent  Social Connections: Socially Isolated (08/25/2023)   Social Connection and  Isolation Panel [NHANES]    Frequency of Communication with Friends and Family: More than three times a week    Frequency of Social Gatherings with Friends and Family: Never    Attends Religious Services: Never    Database administrator or Organizations: No    Attends Banker Meetings: Never    Marital Status: Separated          [place], Polly Brink, MD 02/08/24 1229    Afton Albright, MD 02/08/24 1230

## 2024-02-16 ENCOUNTER — Other Ambulatory Visit: Payer: Self-pay | Admitting: Family Medicine

## 2024-02-16 DIAGNOSIS — F419 Anxiety disorder, unspecified: Secondary | ICD-10-CM

## 2024-08-30 ENCOUNTER — Encounter: Payer: Self-pay | Admitting: Family Medicine

## 2024-08-30 ENCOUNTER — Ambulatory Visit (INDEPENDENT_AMBULATORY_CARE_PROVIDER_SITE_OTHER)

## 2024-08-30 ENCOUNTER — Ambulatory Visit: Admitting: Family Medicine

## 2024-08-30 VITALS — BP 110/60 | HR 72 | Temp 98.1°F | Ht 64.8 in | Wt 131.0 lb

## 2024-08-30 VITALS — BP 110/60 | HR 72 | Temp 98.1°F | Ht 66.0 in | Wt 131.8 lb

## 2024-08-30 DIAGNOSIS — F422 Mixed obsessional thoughts and acts: Secondary | ICD-10-CM

## 2024-08-30 DIAGNOSIS — R7309 Other abnormal glucose: Secondary | ICD-10-CM | POA: Diagnosis not present

## 2024-08-30 DIAGNOSIS — Z0000 Encounter for general adult medical examination without abnormal findings: Secondary | ICD-10-CM | POA: Diagnosis not present

## 2024-08-30 DIAGNOSIS — F411 Generalized anxiety disorder: Secondary | ICD-10-CM

## 2024-08-30 DIAGNOSIS — R21 Rash and other nonspecific skin eruption: Secondary | ICD-10-CM

## 2024-08-30 DIAGNOSIS — E785 Hyperlipidemia, unspecified: Secondary | ICD-10-CM

## 2024-08-30 LAB — CMP14+EGFR
ALT: 16 IU/L (ref 0–32)
AST: 22 IU/L (ref 0–40)
Albumin: 4.6 g/dL (ref 3.9–4.9)
Alkaline Phosphatase: 109 IU/L (ref 49–135)
BUN/Creatinine Ratio: 11 — ABNORMAL LOW (ref 12–28)
BUN: 12 mg/dL (ref 8–27)
Bilirubin Total: 0.4 mg/dL (ref 0.0–1.2)
CO2: 22 mmol/L (ref 20–29)
Calcium: 9.6 mg/dL (ref 8.7–10.3)
Chloride: 101 mmol/L (ref 96–106)
Creatinine, Ser: 1.05 mg/dL — ABNORMAL HIGH (ref 0.57–1.00)
Globulin, Total: 2.5 g/dL (ref 1.5–4.5)
Glucose: 88 mg/dL (ref 70–99)
Potassium: 4.8 mmol/L (ref 3.5–5.2)
Sodium: 137 mmol/L (ref 134–144)
Total Protein: 7.1 g/dL (ref 6.0–8.5)
eGFR: 57 mL/min/1.73 — ABNORMAL LOW (ref >59)

## 2024-08-30 LAB — HEMOGLOBIN A1C
Est. average glucose Bld gHb Est-mCnc: 111 mg/dL
Hgb A1c MFr Bld: 5.5 % (ref 4.8–5.6)

## 2024-08-30 LAB — LIPID PANEL
Chol/HDL Ratio: 3.5 ratio (ref 0.0–4.4)
Cholesterol, Total: 156 mg/dL (ref 100–199)
HDL: 45 mg/dL (ref >39)
LDL Chol Calc (NIH): 93 mg/dL (ref 0–99)
Triglycerides: 96 mg/dL (ref 0–149)
VLDL Cholesterol Cal: 18 mg/dL (ref 5–40)

## 2024-08-30 LAB — CBC
Hematocrit: 42.7 % (ref 34.0–46.6)
Hemoglobin: 13.5 g/dL (ref 11.1–15.9)
MCH: 28.7 pg (ref 26.6–33.0)
MCHC: 31.6 g/dL (ref 31.5–35.7)
MCV: 91 fL (ref 79–97)
Platelets: 99 x10E3/uL — CL (ref 150–450)
RBC: 4.7 x10E6/uL (ref 3.77–5.28)
RDW: 13.7 % (ref 11.7–15.4)
WBC: 8.7 x10E3/uL (ref 3.4–10.8)

## 2024-08-30 MED ORDER — CLOBETASOL PROPIONATE 0.05 % EX OINT: 1.0000 | TOPICAL_OINTMENT | Freq: Two times a day (BID) | CUTANEOUS | 0 refills | Status: AC

## 2024-08-30 MED ORDER — ROSUVASTATIN CALCIUM 20 MG PO TABS: 20.0000 mg | ORAL_TABLET | Freq: Every day | ORAL | 1 refills | Status: AC

## 2024-08-30 NOTE — Progress Notes (Signed)
 I,Vanessa Escobar, CMA,acting as a neurosurgeon for Vanessa Lynch, NP.,have documented all relevant documentation on the behalf of Vanessa Creighton, NP,as directed by  Vanessa Creighton, NP while in the presence of Vanessa Creighton, NP.  Subjective:  Patient ID: Vanessa Escobar , female    DOB: 1956/08/08 , 68 y.o.   MRN: [id number]  Chief Complaint  Patient presents with   Hyperlipidemia    Patient presents today for a chol check. Patient reports she has been taking her meds every other night. Patient reports she feels her blood sugar has been increasing.    Rash    Patient reports she gets a super itchy blistered rash on her elbows. This has been going on for a few years now and has gotten worse.     HPI Discussed the use of AI scribe software for clinical note transcription with the patient, who gave verbal consent to proceed.  History of Present Illness     [name] is a 68 year old female who presents for a follow-up visit.  She was placed on medication for elevated cholesterol last year and takes it every other night due to concerns about its impact on her blood sugar, which was also elevated previously. She has not had a follow-up blood test since starting the medication.  She has a history of anxiety and is on Prozac , which has helped her regain her appetite, leading to weight gain. Despite this, she experiences significant anxiety, describing herself as a 'bundle of nerves most of the time.' She was diagnosed with OCD by a psychologist approximately five years ago and has undergone cognitive behavioral therapy in the past. She is reluctant to pursue further therapy due to the cost and effort involved.  She reports itchy blisters on her elbows that have been occurring for a couple of years. Initially, one blister would heal before another appeared, but now they occur more frequently and simultaneously. The blisters start as itchy, then deflate and scab over, leaving the area red and  painful. She denies joint pain, fever, or swelling when asked about symptoms associated with the blisters. She has been using Neosporin for pain relief.     Past Medical History:  Diagnosis Date   Anxiety disorder 12/09/2017   Squamous cell skin cancer      Family History  Problem Relation Age of Onset   Breast cancer Mother 37   Dementia Father    Parkinson's disease Father      Current Outpatient Medications:    clobetasol  ointment (TEMOVATE ) 0.05 %, Apply 1 Application topically 2 (two) times daily. (Patient not taking: Reported on 08/30/2024), Disp: 30 g, Rfl: 0   FLUoxetine  (PROZAC ) 20 MG capsule, TAKE 3 CAPSULES(60 MG) BY MOUTH DAILY, Disp: 90 capsule, Rfl: 3   amoxicillin -clavulanate (AUGMENTIN ) 875-125 MG tablet, Take 1 tablet by mouth every 12 (twelve) hours. (Patient not taking: Reported on 08/30/2024), Disp: 14 tablet, Rfl: 0   rosuvastatin  (CRESTOR ) 20 MG tablet, Take 1 tablet (20 mg total) by mouth daily., Disp: 90 tablet, Rfl: 1   Allergies  Allergen Reactions   Aspirin Nausea Only     Review of Systems  Constitutional: Negative.   Respiratory: Negative.    Cardiovascular: Negative.   Gastrointestinal: Negative.   Genitourinary: Negative.   Musculoskeletal: Negative.   Skin:  Positive for rash.  Neurological: Negative.   Psychiatric/Behavioral:  The patient is nervous/anxious.      Today's Vitals   08/30/24 1557  BP: 110/60  Pulse:  72  Temp: 98.1 F (36.7 C)  TempSrc: Oral  Weight: 131 lb (59.4 kg)  Height: 5' 4.8 (1.646 m)  PainSc: 0-No pain   Body mass index is 21.93 kg/m.  Wt Readings from Last 3 Encounters:  08/30/24 131 lb 12.8 oz (59.8 kg)  08/30/24 131 lb (59.4 kg)  08/30/24 131 lb (59.4 kg)    The 10-year ASCVD risk score (Arnett DK, et al., 2019) is: 11.1%   Values used to calculate the score:     Age: 12 years     Clincally relevant sex: Female     Is Non-Hispanic African American: No     Diabetic: No     Tobacco smoker: Yes      Systolic Blood Pressure: 110 mmHg     Is BP treated: No     HDL Cholesterol: 45 mg/dL     Total Cholesterol: 156 mg/dL  Objective:  Physical Exam Constitutional:      Appearance: Normal appearance.  Cardiovascular:     Rate and Rhythm: Normal rate and regular rhythm.     Pulses: Normal pulses.     Heart sounds: Normal heart sounds.  Pulmonary:     Effort: Pulmonary effort is normal.     Breath sounds: Normal breath sounds.  Abdominal:     General: Bowel sounds are normal.  Skin:    Findings: Rash present.  Neurological:     Mental Status: She is alert and oriented to person, place, and time.  Psychiatric:        Mood and Affect: Mood is anxious.        Behavior: Behavior is cooperative.         Assessment And Plan:   Assessment & Plan Hyperlipidemia LDL goal <100 -check lipids - Sent refill for rosuvastatin . Abnormal glucose Lab Results  Component Value Date   HGBA1C 5.5 08/30/2024   HGBA1C 5.7 (H) 05/13/2023   Check A1c Rash and other nonspecific skin eruption - Prescribed steroid cream for itching and inflammation. - Advised follow-up with dermatologist if symptoms persist. GAD (generalized anxiety disorder) Prozac  helps appetite but not anxiety. Concerns about side effects, including weight gain and psychological issues. No interest in re-evaluation or therapy. - Continue Prozac  with refills. - Schedule follow-up every six months to monitor Prozac  use.         Return in about 3 months (around 11/30/2024) for physical.  Patient was given opportunity to ask questions. Patient verbalized understanding of the plan and was able to repeat key elements of the plan. All questions were answered to their satisfaction.    I, Vanessa Creighton, NP, have reviewed all documentation for this visit. The documentation on 09/11/2024 for the exam, diagnosis, procedures, and orders are all accurate and complete.   IF YOU HAVE BEEN REFERRED TO A SPECIALIST, IT MAY TAKE 1-2 WEEKS  TO SCHEDULE/PROCESS THE REFERRAL. IF YOU HAVE NOT HEARD FROM US /SPECIALIST IN TWO WEEKS, PLEASE GIVE US  A CALL AT [phone].

## 2024-08-30 NOTE — Progress Notes (Signed)
 This encounter was created in error - please disregard.

## 2024-08-30 NOTE — Progress Notes (Signed)
 Chief Complaint  Patient presents with   Medicare Wellness     Subjective:   Vanessa Escobar is a 68 y.o. female who presents for a [employer] Visit.  Allergies (verified) Aspirin   History: Past Medical History:  Diagnosis Date   Anxiety disorder 12/09/2017   Squamous cell skin cancer    Past Surgical History:  Procedure Laterality Date   SKIN CANCER EXCISION Left 01/21/2023   left lower leg, squamos cell   Family History  Problem Relation Age of Onset   Breast cancer Mother 53   Dementia Father    Parkinson's disease Father    Social History   Occupational History   Not on file  Tobacco Use   Smoking status: Every Day    Current packs/day: 1.00    Types: Cigarettes   Smokeless tobacco: Never  Vaping Use   Vaping status: Never Used  Substance and Sexual Activity   Alcohol use: Never   Drug use: Never   Sexual activity: Not on file   Tobacco Counseling Ready to quit: Not Answered Counseling given: Not Answered  SDOH Screenings   Food Insecurity: No Food Insecurity (08/30/2024)  Housing: Unknown (08/30/2024)  Transportation Needs: No Transportation Needs (08/25/2023)  Utilities: Not At Risk (08/30/2024)  Alcohol Screen: Low Risk  (08/30/2024)  Depression (PHQ2-9): Low Risk  (08/30/2024)  Financial Resource Strain: Low Risk  (08/30/2024)  Physical Activity: Inactive (08/30/2024)  Social Connections: Socially Isolated (08/30/2024)  Stress: No Stress Concern Present (08/30/2024)  Tobacco Use: High Risk (08/30/2024)  Health Literacy: Adequate Health Literacy (08/30/2024)   See flowsheets for full screening details  Depression Screen PHQ 2 & 9 Depression Scale- Over the past 2 weeks, how often have you been bothered by any of the following problems? Little interest or pleasure in doing things: 0 Feeling down, depressed, or hopeless (PHQ Adolescent also includes...irritable): 0 PHQ-2 Total Score: 0 Trouble falling or staying asleep,  or sleeping too much: 0 Feeling tired or having little energy: 0 Poor appetite or overeating (PHQ Adolescent also includes...weight loss): 0 Feeling bad about yourself - or that you are a failure or have let yourself or your family down: 0 Trouble concentrating on things, such as reading the newspaper or watching television (PHQ Adolescent also includes...like school work): 0 Moving or speaking so slowly that other people could have noticed. Or the opposite - being so fidgety or restless that you have been moving around a lot more than usual: 0 Thoughts that you would be better off dead, or of hurting yourself in some way: 0 PHQ-9 Total Score: 0 If you checked off any problems, how difficult have these problems made it for you to do your work, take care of things at home, or get along with other people?: Not difficult at all  Depression Treatment Depression Interventions/Treatment : EYV7-0 Score <4 Follow-up Not Indicated     Goals Addressed             This Visit's Progress    Patient Stated       08/30/2024, be careful       Visit info / Clinical Intake: Medicare Wellness Visit Type:: Subsequent Annual Wellness Visit Persons participating in visit:: patient Medicare Wellness Visit Mode:: In-person (required for WTM) Information given by:: patient Interpreter Needed?: No Pre-visit prep was completed: yes AWV questionnaire completed by patient prior to visit?: no Living arrangements:: (!) lives alone Patient's Overall Health Status Rating: very good Typical amount of pain: none Does  pain affect daily life?: no Are you currently prescribed opioids?: no  Dietary Habits and Nutritional Risks How many meals a day?: 2 Eats fruit and vegetables daily?: (!) no Most meals are obtained by: preparing own meals; eating out In the last 2 weeks, have you had any of the following?: none Diabetic:: no  Functional Status Activities of Daily Living (to include ambulation/medication):  Independent Ambulation: Independent Medication Administration: Independent Home Management: Independent Manage your own finances?: yes Primary transportation is: driving Concerns about vision?: (!) yes (does not see as good with new glasses) Concerns about hearing?: no  Fall Screening Falls in the past year?: 0 Number of falls in past year: 0 Was there an injury with Fall?: 0 Fall Risk Category Calculator: 0 Patient Fall Risk Level: Low Fall Risk  Fall Risk Patient at Risk for Falls Due to: Medication side effect Fall risk Follow up: Falls prevention discussed; Falls evaluation completed  Home and Transportation Safety: All rugs have non-skid backing?: N/A, no rugs All stairs or steps have railings?: yes Grab bars in the bathtub or shower?: (!) no Have non-skid surface in bathtub or shower?: yes Good home lighting?: yes Regular seat belt use?: yes Hospital stays in the last year:: no  Cognitive Assessment Difficulty concentrating, remembering, or making decisions? : no Will 6CIT or Mini Cog be Completed: yes What year is it?: 0 points What month is it?: 0 points Give patient an address phrase to remember (5 components): [address] About what time is it?: 0 points Count backwards from 20 to 1: 0 points Say the months of the year in reverse: 0 points Repeat the address phrase from earlier: 4 points 6 CIT Score: 4 points  Advance Directives (For Healthcare) Does Patient Have a Medical Advance Directive?: No Would patient like information on creating a medical advance directive?: No - Patient declined  Reviewed/Updated  Reviewed/Updated: Reviewed All (Medical, Surgical, Family, Medications, Allergies, Care Teams, Patient Goals)        Objective:    Today's Vitals   08/30/24 1637  BP: 110/60  Pulse: 72  Temp: 98.1 F (36.7 C)  TempSrc: Oral  Weight: 131 lb 12.8 oz (59.8 kg)  Height: 5' 6 (1.676 m)   Body mass index is 21.27 kg/m.  Current  Medications (verified) Outpatient Encounter Medications as of 08/30/2024  Medication Sig   FLUoxetine  (PROZAC ) 20 MG capsule TAKE 3 CAPSULES(60 MG) BY MOUTH DAILY   rosuvastatin  (CRESTOR ) 20 MG tablet Take 1 tablet (20 mg total) by mouth daily.   amoxicillin -clavulanate (AUGMENTIN ) 875-125 MG tablet Take 1 tablet by mouth every 12 (twelve) hours. (Patient not taking: Reported on 08/30/2024)   clobetasol ointment (TEMOVATE) 0.05 % Apply 1 Application topically 2 (two) times daily. (Patient not taking: Reported on 08/30/2024)   No facility-administered encounter medications on file as of 08/30/2024.   Hearing/Vision screen Hearing Screening - Comments:: Denies hearing issues Vision Screening - Comments:: Regular eye exams, [place] [facility] Immunizations and Health Maintenance Health Maintenance  Topic Date Due   Hepatitis C Screening  Never done   Colonoscopy  12/04/2015   Mammogram  11/15/2023   COVID-19 Vaccine (1) 09/15/2024 (Originally 07/19/1961)   Zoster Vaccines- Shingrix (1 of 2) 11/30/2024 (Originally [birth year]18/1975)   Influenza Vaccine  01/09/2025 (Originally 05/12/2024)   Pneumococcal Vaccine: 50+ Years (1 of 2 - PCV) 08/30/2025 (Originally [birth year]18/1975)   Medicare Annual Wellness (AWV)  08/30/2025   Bone Density Scan  Completed   Meningococcal B Vaccine  Aged  Out   DTaP/Tdap/Td  Discontinued        Assessment/Plan:  This is a routine wellness examination for Vanessa Escobar.  Patient Care Team: Petrina Pries, NP as PCP - General (Family Medicine)  I have personally reviewed and noted the following in the patient's chart:   Medical and social history Use of alcohol, tobacco or illicit drugs  Current medications and supplements including opioid prescriptions. Functional ability and status Nutritional status Physical activity Advanced directives List of other physicians Hospitalizations, surgeries, and ER visits in previous 12 months Vitals Screenings to include cognitive,  depression, and falls Referrals and appointments  No orders of the defined types were placed in this encounter.  In addition, I have reviewed and discussed with patient certain preventive protocols, quality metrics, and best practice recommendations. A written personalized care plan for preventive services as well as general preventive health recommendations were provided to patient.   Vanessa FORBES Dawn, LPN   [id number]/[id number]   Return in 1 year (on 08/30/2025).  After Visit Summary: (In Person-Printed) AVS printed and given to the patient  Nurse Notes: none

## 2024-08-30 NOTE — Patient Instructions (Signed)
 Vanessa Escobar,  Thank you for taking the time for your Medicare Wellness Visit. I appreciate your continued commitment to your health goals. Please review the care plan we discussed, and feel free to reach out if I can assist you further.  Please note that Annual Wellness Visits do not include a physical exam. Some assessments may be limited, especially if the visit was conducted virtually. If needed, we may recommend an in-person follow-up with your provider.  Ongoing Care Seeing your primary care provider every 3 to 6 months helps us  monitor your health and provide consistent, personalized care.   Referrals If a referral was made during today's visit and you haven't received any updates within two weeks, please contact the referred provider directly to check on the status.  Recommended Screenings:  Health Maintenance  Topic Date Due   COVID-19 Vaccine (1) Never done   Hepatitis C Screening  Never done   Pneumococcal Vaccine for age over 60 (1 of 2 - PCV) Never done   Zoster (Shingles) Vaccine (1 of 2) Never done   Colon Cancer Screening  12/04/2015   Breast Cancer Screening  11/15/2023   Flu Shot  Never done   Medicare Annual Wellness Visit  08/24/2024   Osteoporosis screening with Bone Density Scan  Completed   Meningitis B Vaccine  Aged Out   DTaP/Tdap/Td vaccine  Discontinued       08/30/2024    4:47 PM  Advanced Directives  Does Patient Have a Medical Advance Directive? No  Would patient like information on creating a medical advance directive? No - Patient declined    Vision: Annual vision screenings are recommended for early detection of glaucoma, cataracts, and diabetic retinopathy. These exams can also reveal signs of chronic conditions such as diabetes and high blood pressure.  Dental: Annual dental screenings help detect early signs of oral cancer, gum disease, and other conditions linked to overall health, including heart disease and diabetes.  Please see the  attached documents for additional preventive care recommendations.

## 2024-08-30 NOTE — Patient Instructions (Signed)

## 2024-08-31 ENCOUNTER — Ambulatory Visit: Payer: Self-pay | Admitting: Family Medicine

## 2024-08-31 ENCOUNTER — Ambulatory Visit: Admitting: Family Medicine

## 2024-08-31 DIAGNOSIS — N1831 Chronic kidney disease, stage 3a: Secondary | ICD-10-CM | POA: Insufficient documentation

## 2024-08-31 DIAGNOSIS — D696 Thrombocytopenia, unspecified: Secondary | ICD-10-CM | POA: Insufficient documentation

## 2024-08-31 NOTE — Progress Notes (Signed)
 Platelets count is 99 which is below normal and I know that you are not on a blood thinner. Do you take aspirin or ibuprofen? I will be refer you to a hematologist for further evaluation.  Your kidney function has dropped from last year, your results indicated kidney disease stage 3, avoid /minimize ibuprofen,Advil,aleve and its like and we will recheck.  Your A1c and cholesterol has improved greatly , no more prediabetes.   Thank you!

## 2024-09-01 NOTE — Patient Instructions (Signed)
 Ms. Iwai,  Thank you for taking the time for your Medicare Wellness Visit. I appreciate your continued commitment to your health goals. Please review the care plan we discussed, and feel free to reach out if I can assist you further.  Please note that Annual Wellness Visits do not include a physical exam. Some assessments may be limited, especially if the visit was conducted virtually. If needed, we may recommend an in-person follow-up with your provider.  Ongoing Care Seeing your primary care provider every 3 to 6 months helps us  monitor your health and provide consistent, personalized care.   Referrals If a referral was made during today's visit and you haven't received any updates within two weeks, please contact the referred provider directly to check on the status.  Recommended Screenings:  Health Maintenance  Topic Date Due   Hepatitis C Screening  Never done   Colon Cancer Screening  12/04/2015   Breast Cancer Screening  11/15/2023   COVID-19 Vaccine (1) 09/15/2024*   Zoster (Shingles) Vaccine (1 of 2) 11/30/2024*   Flu Shot  01/09/2025*   Pneumococcal Vaccine for age over 62 (1 of 2 - PCV) 08/30/2025*   Medicare Annual Wellness Visit  08/30/2025   Osteoporosis screening with Bone Density Scan  Completed   Meningitis B Vaccine  Aged Out   DTaP/Tdap/Td vaccine  Discontinued  *Topic was postponed. The date shown is not the original due date.       08/30/2024    4:47 PM  Advanced Directives  Does Patient Have a Medical Advance Directive? No  Would patient like information on creating a medical advance directive? No - Patient declined    Vision: Annual vision screenings are recommended for early detection of glaucoma, cataracts, and diabetic retinopathy. These exams can also reveal signs of chronic conditions such as diabetes and high blood pressure.  Dental: Annual dental screenings help detect early signs of oral cancer, gum disease, and other conditions linked to  overall health, including heart disease and diabetes.  Please see the attached documents for additional preventive care recommendations.

## 2024-09-01 NOTE — Progress Notes (Addendum)
 Chief Complaint  Patient presents with   Medicare Wellness     Subjective:   Vanessa Escobar is a 68 y.o. female who presents for a [employer] Visit.  Allergies (verified) Aspirin   History: Past Medical History:  Diagnosis Date   Anxiety disorder 12/09/2017   Squamous cell skin cancer    Past Surgical History:  Procedure Laterality Date   SKIN CANCER EXCISION Left 01/21/2023   left lower leg, squamos cell   Family History  Problem Relation Age of Onset   Breast cancer Mother 52   Dementia Father    Parkinson's disease Father    Social History   Occupational History   Not on file  Tobacco Use   Smoking status: Every Day    Current packs/day: 1.00    Types: Cigarettes   Smokeless tobacco: Never  Vaping Use   Vaping status: Never Used  Substance and Sexual Activity   Alcohol use: Never   Drug use: Never   Sexual activity: Not on file   Tobacco Counseling Ready to quit: Not Answered Counseling given: Not Answered  SDOH Screenings   Food Insecurity: No Food Insecurity (08/30/2024)  Housing: Unknown (08/30/2024)  Transportation Needs: No Transportation Needs (08/25/2023)  Utilities: Not At Risk (08/30/2024)  Alcohol Screen: Low Risk  (08/30/2024)  Depression (PHQ2-9): Low Risk  (08/30/2024)  Financial Resource Strain: Low Risk  (08/30/2024)  Physical Activity: Inactive (08/30/2024)  Social Connections: Socially Isolated (08/30/2024)  Stress: No Stress Concern Present (08/30/2024)  Tobacco Use: High Risk (08/30/2024)  Health Literacy: Adequate Health Literacy (08/30/2024)   See flowsheets for full screening details  Depression Screen PHQ 2 & 9 Depression Scale- Over the past 2 weeks, how often have you been bothered by any of the following problems? Little interest or pleasure in doing things: 0 Feeling down, depressed, or hopeless (PHQ Adolescent also includes...irritable): 0 PHQ-2 Total Score: 0 Trouble falling or staying asleep,  or sleeping too much: 0 Feeling tired or having little energy: 0 Poor appetite or overeating (PHQ Adolescent also includes...weight loss): 0 Feeling bad about yourself - or that you are a failure or have let yourself or your family down: 0 Trouble concentrating on things, such as reading the newspaper or watching television (PHQ Adolescent also includes...like school work): 0 Moving or speaking so slowly that other people could have noticed. Or the opposite - being so fidgety or restless that you have been moving around a lot more than usual: 0 Thoughts that you would be better off dead, or of hurting yourself in some way: 0 PHQ-9 Total Score: 0 If you checked off any problems, how difficult have these problems made it for you to do your work, take care of things at home, or get along with other people?: Not difficult at all  Depression Treatment Depression Interventions/Treatment : EYV7-0 Score <4 Follow-up Not Indicated     Goals Addressed   None    Visit info / Clinical Intake: Medicare Wellness Visit Type:: Subsequent Annual Wellness Visit Persons participating in visit:: patient Medicare Wellness Visit Mode:: In-person (required for WTM) Information given by:: patient Interpreter Needed?: No Pre-visit prep was completed: yes AWV questionnaire completed by patient prior to visit?: no Living arrangements:: (!) lives alone Patient's Overall Health Status Rating: very good Typical amount of pain: none Does pain affect daily life?: no Are you currently prescribed opioids?: no  Dietary Habits and Nutritional Risks How many meals a day?: 2 Eats fruit and vegetables daily?: ROLLEN)  no Most meals are obtained by: preparing own meals; eating out In the last 2 weeks, have you had any of the following?: none Diabetic:: no  Functional Status Activities of Daily Living (to include ambulation/medication): Independent Ambulation: Independent Medication Administration: Independent Home  Management: Independent Manage your own finances?: yes Primary transportation is: driving Concerns about vision?: (!) yes (does not see as good with new glasses) Concerns about hearing?: no  Fall Screening Falls in the past year?: 0 Number of falls in past year: 0 Was there an injury with Fall?: 0 Fall Risk Category Calculator: 0 Patient Fall Risk Level: Low Fall Risk  Fall Risk Patient at Risk for Falls Due to: Medication side effect Fall risk Follow up: Falls prevention discussed; Falls evaluation completed  Home and Transportation Safety: All rugs have non-skid backing?: N/A, no rugs All stairs or steps have railings?: yes Grab bars in the bathtub or shower?: (!) no Have non-skid surface in bathtub or shower?: yes Good home lighting?: yes Regular seat belt use?: yes Hospital stays in the last year:: no  Cognitive Assessment Difficulty concentrating, remembering, or making decisions? : no Will 6CIT or Mini Cog be Completed: yes What year is it?: 0 points What month is it?: 0 points Give patient an address phrase to remember (5 components): [address] About what time is it?: 0 points Count backwards from 20 to 1: 0 points Say the months of the year in reverse: 0 points Repeat the address phrase from earlier: 4 points 6 CIT Score: 4 points  Advance Directives (For Healthcare) Does Patient Have a Medical Advance Directive?: No Would patient like information on creating a medical advance directive?: No - Patient declined  Reviewed/Updated  Reviewed/Updated: Reviewed All (Medical, Surgical, Family, Medications, Allergies, Care Teams, Patient Goals)        Objective:    Today's Vitals   08/30/24 1630  BP: 110/60  Pulse: 72  Temp: 98.1 F (36.7 C)  TempSrc: Oral  Weight: 131 lb (59.4 kg)  Height: 5' 6 (1.676 m)   Body mass index is 21.14 kg/m.  Current Medications (verified) Outpatient Encounter Medications as of 08/30/2024  Medication Sig    amoxicillin -clavulanate (AUGMENTIN ) 875-125 MG tablet Take 1 tablet by mouth every 12 (twelve) hours. (Patient not taking: Reported on 08/30/2024)   FLUoxetine  (PROZAC ) 20 MG capsule TAKE 3 CAPSULES(60 MG) BY MOUTH DAILY   [DISCONTINUED] rosuvastatin  (CRESTOR ) 20 MG tablet TAKE 1 TABLET(20 MG) BY MOUTH DAILY   No facility-administered encounter medications on file as of 08/30/2024.   Hearing/Vision screen Hearing Screening - Comments:: Denies hearing issues Vision Screening - Comments:: Regular eye exams Immunizations and Health Maintenance Health Maintenance  Topic Date Due   Hepatitis C Screening  Never done   Colonoscopy  12/04/2015   Mammogram  11/15/2023   COVID-19 Vaccine (1) 09/15/2024 (Originally 07/19/1961)   Zoster Vaccines- Shingrix (1 of 2) 11/30/2024 (Originally 07[birth year]18/1975)   Influenza Vaccine  01/09/2025 (Originally 05/12/2024)   Pneumococcal Vaccine: 50+ Years (1 of 2 - PCV) 08/30/2025 (Originally 07[birth year]18/1975)   Medicare Annual Wellness (AWV)  08/30/2025   Bone Density Scan  Completed   Meningococcal B Vaccine  Aged Out   DTaP/Tdap/Td  Discontinued        Assessment/Plan:  This is a routine wellness examination for Vanessa Escobar.  Patient Care Team: Petrina Pries, NP as PCP - General (Family Medicine)  I have personally reviewed and noted the following in the patient's chart:   Medical and social history  Use of alcohol, tobacco or illicit drugs  Current medications and supplements including opioid prescriptions. Functional ability and status Nutritional status Physical activity Advanced directives List of other physicians Hospitalizations, surgeries, and ER visits in previous 12 months Vitals Screenings to include cognitive, depression, and falls Referrals and appointments  No orders of the defined types were placed in this encounter.  In addition, I have reviewed and discussed with patient certain preventive protocols, quality metrics, and best practice  recommendations. A written personalized care plan for preventive services as well as general preventive health recommendations were provided to patient.   Vanessa FORBES Dawn, LPN   88/80/7974   No follow-ups on file.  After Visit Summary: (In Person-Printed) AVS printed and given to the patient  Nurse Notes: Declines vaccines.

## 2024-09-01 NOTE — Addendum Note (Signed)
 Addended by: Videl Nobrega E on: 09/01/2024 01:36 PM   Modules accepted: Orders

## 2024-09-11 DIAGNOSIS — R21 Rash and other nonspecific skin eruption: Secondary | ICD-10-CM | POA: Insufficient documentation

## 2024-09-11 DIAGNOSIS — R7309 Other abnormal glucose: Secondary | ICD-10-CM | POA: Insufficient documentation

## 2024-09-11 DIAGNOSIS — F411 Generalized anxiety disorder: Secondary | ICD-10-CM | POA: Insufficient documentation

## 2024-09-11 NOTE — Assessment & Plan Note (Signed)
 Prozac  helps appetite but not anxiety. Concerns about side effects, including weight gain and psychological issues. No interest in re-evaluation or therapy. - Continue Prozac  with refills. - Schedule follow-up every six months to monitor Prozac  use.

## 2024-09-11 NOTE — Assessment & Plan Note (Signed)
-  check lipids - Sent refill for rosuvastatin .

## 2024-09-11 NOTE — Assessment & Plan Note (Signed)
 Lab Results  Component Value Date   HGBA1C 5.5 08/30/2024   HGBA1C 5.7 (H) 05/13/2023   Check A1c

## 2024-09-11 NOTE — Assessment & Plan Note (Signed)
-   Prescribed steroid cream for itching and inflammation. - Advised follow-up with dermatologist if symptoms persist.

## 2024-11-30 ENCOUNTER — Ambulatory Visit: Payer: Self-pay | Admitting: Family Medicine

## 2025-03-01 ENCOUNTER — Encounter: Payer: Self-pay | Admitting: Family Medicine

## 2025-09-26 ENCOUNTER — Ambulatory Visit
